# Patient Record
Sex: Male | Born: 1945 | Race: White | Hispanic: No | Marital: Married | Smoking: Current every day smoker
Health system: Southern US, Community
[De-identification: ages and names within clinical notes are randomized; demographics above are authoritative.]

## PROBLEM LIST (undated history)

## (undated) DIAGNOSIS — I714 Abdominal aortic aneurysm, without rupture, unspecified: Secondary | ICD-10-CM

## (undated) DIAGNOSIS — K219 Gastro-esophageal reflux disease without esophagitis: Secondary | ICD-10-CM

## (undated) DIAGNOSIS — J45909 Unspecified asthma, uncomplicated: Secondary | ICD-10-CM

## (undated) DIAGNOSIS — R058 Other specified cough: Secondary | ICD-10-CM

## (undated) DIAGNOSIS — J449 Chronic obstructive pulmonary disease, unspecified: Secondary | ICD-10-CM

## (undated) DIAGNOSIS — R05 Cough: Secondary | ICD-10-CM

## (undated) DIAGNOSIS — I10 Essential (primary) hypertension: Secondary | ICD-10-CM

## (undated) DIAGNOSIS — E785 Hyperlipidemia, unspecified: Secondary | ICD-10-CM

## (undated) DIAGNOSIS — F419 Anxiety disorder, unspecified: Secondary | ICD-10-CM

## (undated) DIAGNOSIS — J40 Bronchitis, not specified as acute or chronic: Secondary | ICD-10-CM

## (undated) DIAGNOSIS — D369 Benign neoplasm, unspecified site: Secondary | ICD-10-CM

## (undated) HISTORY — DX: Abdominal aortic aneurysm, without rupture, unspecified: I71.40

## (undated) HISTORY — DX: Hyperlipidemia, unspecified: E78.5

## (undated) HISTORY — DX: Chronic obstructive pulmonary disease, unspecified: J44.9

## (undated) HISTORY — DX: Essential (primary) hypertension: I10

## (undated) HISTORY — DX: Abdominal aortic aneurysm, without rupture: I71.4

## (undated) HISTORY — DX: Cough: R05

## (undated) HISTORY — DX: Bronchitis, not specified as acute or chronic: J40

## (undated) HISTORY — DX: Other specified cough: R05.8

## (undated) HISTORY — DX: Benign neoplasm, unspecified site: D36.9

---

## 1985-01-22 HISTORY — PX: SPINE SURGERY: SHX786

## 1998-11-17 ENCOUNTER — Inpatient Hospital Stay (HOSPITAL_COMMUNITY): Admission: EM | Admit: 1998-11-17 | Discharge: 1998-11-18 | Payer: Self-pay | Admitting: Cardiology

## 2000-06-10 ENCOUNTER — Ambulatory Visit (HOSPITAL_COMMUNITY): Admission: RE | Admit: 2000-06-10 | Discharge: 2000-06-10 | Payer: Self-pay | Admitting: Pulmonary Disease

## 2000-06-17 ENCOUNTER — Ambulatory Visit (HOSPITAL_COMMUNITY): Admission: RE | Admit: 2000-06-17 | Discharge: 2000-06-17 | Payer: Self-pay | Admitting: Internal Medicine

## 2000-06-17 HISTORY — PX: COLONOSCOPY: SHX174

## 2000-06-20 ENCOUNTER — Ambulatory Visit (HOSPITAL_COMMUNITY): Admission: RE | Admit: 2000-06-20 | Discharge: 2000-06-20 | Payer: Self-pay | Admitting: Internal Medicine

## 2000-09-16 ENCOUNTER — Ambulatory Visit (HOSPITAL_COMMUNITY): Admission: RE | Admit: 2000-09-16 | Discharge: 2000-09-16 | Payer: Self-pay | Admitting: Internal Medicine

## 2000-09-16 HISTORY — PX: COLONOSCOPY: SHX174

## 2003-01-23 HISTORY — PX: FOOT SURGERY: SHX648

## 2003-10-21 ENCOUNTER — Encounter: Admission: RE | Admit: 2003-10-21 | Discharge: 2003-10-21 | Payer: Self-pay | Admitting: Orthopedic Surgery

## 2003-11-12 ENCOUNTER — Inpatient Hospital Stay (HOSPITAL_COMMUNITY): Admission: AD | Admit: 2003-11-12 | Discharge: 2003-11-16 | Payer: Self-pay | Admitting: Orthopedic Surgery

## 2003-11-13 ENCOUNTER — Ambulatory Visit: Payer: Self-pay | Admitting: Internal Medicine

## 2003-11-17 ENCOUNTER — Encounter (HOSPITAL_COMMUNITY): Admission: RE | Admit: 2003-11-17 | Discharge: 2003-12-17 | Payer: Self-pay | Admitting: Orthopedic Surgery

## 2003-12-21 ENCOUNTER — Encounter (HOSPITAL_COMMUNITY): Admission: RE | Admit: 2003-12-21 | Discharge: 2004-01-20 | Payer: Self-pay | Admitting: Orthopedic Surgery

## 2003-12-24 ENCOUNTER — Encounter: Admission: RE | Admit: 2003-12-24 | Discharge: 2003-12-24 | Payer: Self-pay | Admitting: Orthopedic Surgery

## 2004-04-18 ENCOUNTER — Ambulatory Visit (HOSPITAL_COMMUNITY): Admission: RE | Admit: 2004-04-18 | Discharge: 2004-04-18 | Payer: Self-pay | Admitting: Orthopedic Surgery

## 2004-06-28 ENCOUNTER — Encounter: Admission: RE | Admit: 2004-06-28 | Discharge: 2004-06-28 | Payer: Self-pay | Admitting: Vascular Surgery

## 2005-02-27 ENCOUNTER — Ambulatory Visit (HOSPITAL_COMMUNITY): Admission: RE | Admit: 2005-02-27 | Discharge: 2005-02-27 | Payer: Self-pay | Admitting: Urology

## 2006-02-20 ENCOUNTER — Encounter: Admission: RE | Admit: 2006-02-20 | Discharge: 2006-02-20 | Payer: Self-pay | Admitting: Vascular Surgery

## 2006-07-09 ENCOUNTER — Ambulatory Visit (HOSPITAL_COMMUNITY): Admission: RE | Admit: 2006-07-09 | Discharge: 2006-07-09 | Payer: Self-pay | Admitting: Family Medicine

## 2006-07-31 ENCOUNTER — Ambulatory Visit: Payer: Self-pay | Admitting: Vascular Surgery

## 2007-02-04 ENCOUNTER — Encounter: Admission: RE | Admit: 2007-02-04 | Discharge: 2007-02-04 | Payer: Self-pay | Admitting: Vascular Surgery

## 2007-02-04 ENCOUNTER — Ambulatory Visit: Payer: Self-pay | Admitting: Vascular Surgery

## 2007-06-30 ENCOUNTER — Ambulatory Visit (HOSPITAL_COMMUNITY): Admission: RE | Admit: 2007-06-30 | Discharge: 2007-06-30 | Payer: Self-pay | Admitting: Family Medicine

## 2007-08-05 ENCOUNTER — Ambulatory Visit: Payer: Self-pay | Admitting: Vascular Surgery

## 2008-02-03 ENCOUNTER — Encounter: Admission: RE | Admit: 2008-02-03 | Discharge: 2008-02-03 | Payer: Self-pay | Admitting: Vascular Surgery

## 2008-02-03 ENCOUNTER — Ambulatory Visit: Payer: Self-pay | Admitting: Vascular Surgery

## 2008-08-19 ENCOUNTER — Ambulatory Visit (HOSPITAL_COMMUNITY): Admission: RE | Admit: 2008-08-19 | Discharge: 2008-08-19 | Payer: Self-pay | Admitting: Family Medicine

## 2008-10-07 ENCOUNTER — Ambulatory Visit: Payer: Self-pay | Admitting: Vascular Surgery

## 2009-07-14 ENCOUNTER — Ambulatory Visit: Payer: Self-pay | Admitting: Vascular Surgery

## 2009-09-28 ENCOUNTER — Emergency Department (HOSPITAL_COMMUNITY): Admission: EM | Admit: 2009-09-28 | Discharge: 2009-09-28 | Payer: Self-pay | Admitting: Emergency Medicine

## 2010-02-12 ENCOUNTER — Encounter: Payer: Self-pay | Admitting: Vascular Surgery

## 2010-03-29 ENCOUNTER — Ambulatory Visit: Payer: Self-pay | Admitting: Vascular Surgery

## 2010-04-19 ENCOUNTER — Ambulatory Visit (INDEPENDENT_AMBULATORY_CARE_PROVIDER_SITE_OTHER): Payer: Medicare Other | Admitting: Vascular Surgery

## 2010-04-19 ENCOUNTER — Encounter (INDEPENDENT_AMBULATORY_CARE_PROVIDER_SITE_OTHER): Payer: Medicare Other

## 2010-04-19 DIAGNOSIS — I723 Aneurysm of iliac artery: Secondary | ICD-10-CM

## 2010-04-20 NOTE — Assessment & Plan Note (Signed)
OFFICE VISIT  Jared Guerrero, Jared Guerrero DOB:  January 08, 1946                                       04/19/2010 JXBJY#:[id number]  I saw the patient in the office today for continued follow-up of his bilateral common iliac artery aneurysms.  I last saw him in June 2011. At that time the largest aneurysm on the right measured 3.3 cm in maximum diameter.  As far back as January 2008 it was 3.1 cm in maximum diameter.  We therefore stretched his follow-up out to 9 months and he comes in for a 62-month follow-up visit.  Of note, he has had no abdominal or back pain.  He does have a history of diabetes, which has been stable on his current medications.  He also has hypertension and hypercholesterolemia, also both stable on his current medications and followed closely by Dr. Phillips Odor.  SOCIAL HISTORY:  He is married.  He has one child.  He smokes a pack per day of cigarettes.  REVIEW OF SYSTEMS:  CARDIOVASCULAR:  He has had no chest pain, chest pressure, palpitations or arrhythmias. PULMONARY:  He has had a productive cough. MUSCULOSKELETAL:  He does admit to some joint pain.  PHYSICAL EXAMINATION:  This is a pleasant 65 year old gentleman who appears his stated age.  Blood pressure is 119/76, heart rate is 80, saturation 98%.  Lungs are clear bilaterally to auscultation.  On cardiovascular exam he has a regular rate and rhythm.  He has palpable femoral, popliteal and pedal pulses bilaterally.  The abdomen is soft and nontender.  His aneurysm is palpable and nontender.  Musculoskeletal exam:  There is no major deformity or cyanosis.  Neurologic exam:  He has no focal weakness or paresthesias.  I have independently interpreted his arterial duplex of his aneurysm, which shows the maximum diameter of his right common iliac artery is 3.5 cm and on the left 3.4 cm.  He does not have an infrarenal abdominal aortic aneurysm.  Thus, the aneurysm has enlarged perhaps 2 mm  compared to the study 9 months ago, which may be within the limits of the machine.  Again, these aneurysms have remained very stable over the last 5 years.  I think it is safe to continue to follow up 9 months; however, when I see him back in 9 months I think I will obtain a CT scan of the abdomen and pelvis to better assess the morphology of the aneurysms.  We have again discussed the importance of tobacco cessation.  He does understand that this does increase his risk of continued aneurysm enlargement.  I will see him back in 9 months.  He knows to call sooner if he has problems.    Di Kindle. Edilia Bo, M.D. Electronically Signed  CSD/MEDQ  D:  04/19/2010  T:  04/20/2010  Job:  [id number]  cc:   Corrie Mckusick, M.D.

## 2010-04-27 NOTE — Procedures (Unsigned)
DUPLEX ULTRASOUND OF ABDOMINAL AORTA  INDICATION:  Right common iliac artery aneurysm.  HISTORY: Diabetes:  Yes. Cardiac:  No. Hypertension:  Yes. Smoking:  Yes. Connective Tissue Disorder: Family History:  Grandfather had aneurysm. Previous Surgery:  No.  DUPLEX EXAM:         AP (cm)                   TRANSVERSE (cm) Proximal             2.5 cm                    2.5 cm Mid                  2.4 cm                    2.3 cm Distal               2.7 cm                    2.5 cm Right Iliac          3.5 cm                    3.4 cm Left Iliac           1.6 cm                    1.4 cm  PREVIOUS:  Date: 07/14/2009  AP:  3.3 (right CIA)  TRANSVERSE:  3.3 (right CIA)  IMPRESSION: 1. Aneurysmal dilatation of the right common iliac artery with no     significant change in maximum diameter. 2. No evidence of abdominal aortic aneurysm noted.  ___________________________________________ Di Kindle. Edilia Bo, M.D.  CH/MEDQ  D:  04/19/2010  T:  04/19/2010  Job:  [id number]

## 2010-04-28 ENCOUNTER — Other Ambulatory Visit: Payer: Self-pay | Admitting: Vascular Surgery

## 2010-04-28 DIAGNOSIS — I723 Aneurysm of iliac artery: Secondary | ICD-10-CM

## 2010-04-30 LAB — BASIC METABOLIC PANEL
CO2: 26 mEq/L (ref 19–32)
Calcium: 10 mg/dL (ref 8.4–10.5)
Creatinine, Ser: 1.01 mg/dL (ref 0.4–1.5)
GFR calc non Af Amer: >60 mL/min (ref >60)
Glucose, Bld: 100 mg/dL — ABNORMAL HIGH (ref 70–99)

## 2010-06-06 NOTE — Procedures (Signed)
DUPLEX ULTRASOUND OF ABDOMINAL AORTA   INDICATION:  Right common iliac artery aneurysm.   HISTORY:  Diabetes:  Yes.  Cardiac:  No.  Hypertension:  Yes.  Smoking:  Yes.  Connective Tissue Disorder:  Family History:  Grandfather had aneurysm.  Previous Surgery:  No.   DUPLEX EXAM:         AP (cm)                   TRANSVERSE (cm)  Proximal             2.7 cm                    2.9 cm  Mid                  2.2 cm                    2.4 cm  Distal               2.5 cm                    2.2 cm  Right Iliac          3.3 cm                    3.3 cm  Left Iliac           1.1 cm                    1.2 cm   PREVIOUS:  Date: 08/19/2008 (CT)  AP:  3.1 (right CIA)  TRANSVERSE:  3.0  (right CIA)   IMPRESSION:  1. Aneurysmal dilatation of the right proximal common iliac artery      with no significant change in the maximum diameter when compared to      the previous CT.  2. No evidence of abdominal aortic aneurysm noted.   ___________________________________________  Di Kindle. Edilia Bo, M.D.   CH/MEDQ  D:  07/14/2009  T:  07/14/2009  Job:  [id number]

## 2010-06-06 NOTE — Assessment & Plan Note (Signed)
OFFICE VISIT   AMMAN, BARTEL  DOB:  [birth year]                                       02/04/2007  ZOXWR#:[id number]   I saw the patient in the office today for continued followup of his  right common iliac artery aneurysm.  I last saw him in January 2008 at  which time the maximum diameter was 3.1 cm.  He was back here in July,  at which time the aneurysm remained 3.1 cm in maximum diameter by  duplex.  He comes in today for a 65-month followup visit with a CT scan  done today.   Since I saw him last, he has had no history of abdominal or back pain.   PAST MEDICAL HISTORY:  Significant for non-insulin dependent diabetes  and hypertension.  He denies any history of previous myocardial  infarction or history of congestive heart failure.   SOCIAL HISTORY:  He does continue to smoke a pack per day of cigarettes.   REVIEW OF SYSTEMS:  He has had no recent chest pain, chest pressure,  palpitations, or arrhythmias.  He has had no bronchitis, asthma, or wheezing.   PHYSICAL EXAMINATION:  This is a pleasant 65 year old gentleman who  appears his stated age.  Blood pressure 128/72, heart rate is 90.  I do  not detect any carotid bruits.  Lungs are clear bilaterally to  auscultation.  On cardiac exam, he has a regular rate and rhythm.  His  abdomen is soft and nontender.  His aorta is palpable and nontender.  He  has palpable femoral pulses and warm, well-perfused feet.  He has no  significant lower extremity swelling.   I reviewed his CT scan.  The maximum diameter I can obtain of the right  common iliac artery aneurysm is 3.2 cm.  Thus, there has been no  significant change over the last year.  I would only consider elective  repair if this enlarged significantly.  I plan on seeing him back in 6  months with a followup duplex scan.  He knows to call sooner if he has  problems.  We have, again, discussed the importance of tobacco  cessation.   Di Kindle. Edilia Bo, M.D.  Electronically Signed   CSD/MEDQ  D:  02/04/2007  T:  02/05/2007  Job:  [id number]

## 2010-06-06 NOTE — Assessment & Plan Note (Signed)
OFFICE VISIT   SIERRA, BISSONETTE  DOB:  07-09-1945                                       10/07/2008  ZOXWR#:[id number]   I saw the patient in the office today for followup of his right common  iliac artery aneurysm.  I had last seen him in January of this year when  the aneurysm measured 3.28 cm in maximum diameter.  This had been  relatively stable in size over the last 4 years and for this reason I  stretched his followup out to 9 months.  However, the patient had been  having some weight loss and this prompted a CT scan of the chest,  abdomen and pelvis and so he comes today with his CT scan for continued  followup of his right common iliac artery aneurysm.  Since I saw him  last he has had no significant abdominal or back pain.   REVIEW OF SYSTEMS:  He has had no chest pain, chest pressure,  palpitations or arrhythmias.  He has had no productive cough,  bronchitis, asthma or wheezing.  He has had some weight loss recently.   PHYSICAL EXAMINATION:  This is a pleasant 65 year old gentleman who  appears his stated age.  His blood pressure is 120/74, heart rate is 75.  Lungs are clear bilaterally to auscultation.  On cardiac exam he has a  regular rate and rhythm.  His abdomen is soft and nontender.  He has  palpable femoral, popliteal and pedal pulses bilaterally with no  evidence of atheroembolic disease.   I did review his CT scan and the maximum diameter of his right common  iliac artery aneurysm is 3.1 cm and has thus not changed in size since  January.  This again has been stable over several years.   With respect to his aneurysm I have recommended a followup ultrasound in  9 months and I will see him back at that time.  We would only consider  addressing this aneurysm if it enlarged significantly.  He has  significantly calcific vessels and this would complicate surgery  somewhat.  He is not an ideal candidate for stent graft repair of the  aneurysm as it extends up to the bifurcation.  With respect to his  adrenal adenoma and coronary calcifications seen on his CT scan he tells  me that Dr. Phillips Odor plans on following up on these.   Finally, we did again discuss the importance of tobacco cessation.  He  continues to smoke one and a half pack per day of cigarettes.   Di Kindle. Edilia Bo, M.D.  Electronically Signed   CSD/MEDQ  D:  10/07/2008  T:  10/08/2008  Job:  [id number]   cc:   Corrie Mckusick, M.D.

## 2010-06-06 NOTE — Assessment & Plan Note (Signed)
OFFICE VISIT   Jared, Guerrero  DOB:  Aug 28, 1945                                       07/14/2009  BJYNW#:[id number]   I saw the patient in the office today for continued follow-up of his  right common iliac artery aneurysm.  Since I saw him last in September  2010, he has had no significant abdominal or back pain.  There has been  no significant change in his medical history.  He does have diabetes  which is stable on his current medications.  He also has a history of  hypertension and hypercholesterolemia both of which are stable on his  current medications.  He has had no previous history of myocardial  infarction or congestive heart failure.   SOCIAL HISTORY:  He is married.  He has one child.  He smokes a pack per  day of cigarettes and has been smoking for as long as he can remember.   REVIEW OF SYSTEMS:  CARDIOVASCULAR:  He had no chest pain, chest  pressure, palpitations or arrhythmias.  He has had no claudication, rest  pain or nonhealing ulcers.  He has had no history of DVT or phlebitis.  PULMONARY:  He has had history of bronchitis in the past.  ORTHO:  He does have occasional problems with arthritis.   PHYSICAL EXAMINATION:  This is a pleasant 65 year old gentleman who  appears his stated age.  Temperature is 98.  Blood pressure 125/83,  heart rate is 64.  HEENT:  Unremarkable.  Lungs:  Clear bilaterally to  auscultation without rales, rhonchi or wheezing.  Cardiovascular exam:  I do not detect any carotid bruits.  He has a regular rate and rhythm.  He has palpable femoral pulses.  He has palpable pedal pulses.  Abdomen;  Soft and nontender.  He has normal pitched bowel sounds.   I did independently interpret his arterial duplex of his aneurysm which  shows that his right common iliac artery aneurysm measures 3.3 cm in  maximum diameter.   This has been no significant change in the size of his aneurysm.  Back  in January of 2008 the  aneurysm measured 3.1 cm in maximum diameter.  He  had an ultrasound in July 2009 which showed the aneurysm was 3.3 cm.  He  had subsequent CAT scans which showed  3.2 cm right common iliac artery  aneurysm.  Thus comparing the previous ultrasound there has been no  change at all in he size of this and there has been no change in the  size of this aneurysm really since January of 2008.   I think we are safe to continue with follow-up at 9 months.  I have  ordered a follow-up duplex scan in 9 months and I will see him back at  that time.  He knows to call sooner if he has had problems.  We have  previously discussed the importance of tobacco cessation in lowering his  risks of continued aneurysm expansion.     Di Kindle. Edilia Bo, M.D.  Electronically Signed   CSD/MEDQ  D:  07/14/2009  T:  07/15/2009  Job:  [id number]   cc:   Corrie Mckusick, M.D.

## 2010-06-06 NOTE — Procedures (Signed)
DUPLEX ULTRASOUND OF ABDOMINAL AORTA   INDICATION:  Follow up right common iliac artery aneurysm.   HISTORY:  Diabetes:  Yes.  Cardiac:  No.  Hypertension:  Yes.  Smoking:  Yes.  Connective Tissue Disorder:  Family History:  Grandfather.  Previous Surgery:  No.   DUPLEX EXAM:         AP (cm)                   TRANSVERSE (cm)  Proximal             1.91 Cm                   1.96 cm  Mid                  2.33 cm                   2.23 cm  Distal               2.12 cm                   2.12 cm  Right Iliac          3.27 cm                   3.28 cm  Left Iliac           1.29 cm                   1.31 cm   PREVIOUS:  Date: 02/04/07 (CT)  AP:  3.2 (right CIA)  TRANSVERSE:   IMPRESSION:  1. No evidence of abdominal aortic aneurysm.  2. Stable right common iliac artery aneurysm measuring 3.27 cm X 3.28      cm.  3. No evidence of left common iliac artery aneurysm.  4. No significant changes from previous study.   ___________________________________________  Di Kindle. Edilia Bo, M.D.   AS/MEDQ  D:  08/05/2007  T:  08/05/2007  Job:  [id number]

## 2010-06-06 NOTE — Procedures (Signed)
DUPLEX ULTRASOUND OF ABDOMINAL AORTA   INDICATION:  Followup known right common iliac artery aneurysm.   HISTORY:  Diabetes:  Yes  Cardiac:  No  Hypertension:  Yes  Smoking:  Yes  Connective Tissue Disorder:  Family History:  Grandfather  Previous Surgery:  No   DUPLEX EXAM:         AP (cm)                   TRANSVERSE (cm)  Proximal             1.53 cm                   1.58 cm  Mid                  2.13 cm                   2.20 cm  Distal               2.10 cm                   2.09 cm  Right Iliac          3.11 cm                   2.79 cm  Left Iliac           1.30 cm                   1.33 cm   PREVIOUS:  Previous CIA by CT , January 24, 2006  Right equals 3.1 x 2.9 with a length of 3.3   IMPRESSION:  The abdominal aorta was imaged, Dopplered, and shows no  evidence of aneurysmal dilatation.  The right common iliac artery shows evidence of an aneurysm, measuring  3.11 cm by 2.79 cm with a length of 3.2 cm, which has a shape that  appears borderline saccular/fusiform.   ___________________________________________  Di Kindle. Edilia Bo, M.D.   AS/MEDQ  D:  07/31/2006  T:  07/31/2006  Job:  [id number]

## 2010-06-06 NOTE — Assessment & Plan Note (Signed)
OFFICE VISIT   Jared Guerrero, Jared Guerrero  DOB:  01/13/1946                                       02/03/2008  ZOXWR#:[id number]   I saw the patient in the office today for continued followup of his  right common iliac artery aneurysm.  His most recent study was in July  of 2009 when it measured 3.28 cm in maximum diameter.  He has no  significant abdominal aortic aneurysm and no left common iliac artery  aneurysm.  I had originally seen him in consultation in December of 2005  when it was 3 cm in maximum diameter.  Thus, this has been stable over  the last 4 years.  He comes in for a 6 month followup visit after CT  scan was obtained today.  Since I saw him last he has had no abdominal  or back pain.   REVIEW OF SYSTEMS:  On review of systems he has had no chest pain, chest  pressure, palpitations or arrhythmias.  He has had no bronchitis, asthma  or wheezing.   PHYSICAL EXAMINATION:  General:  On physical examination this is a  pleasant 65 year old gentleman who appears his stated age.  Vital signs:  His blood pressure is 137/84, heart rate is 76.  Neck:  I do not detect  any carotid bruits.  Lungs:  Lungs are clear bilaterally to  auscultation.  Cardiac:  He has a regular rate and rhythm.  Abdomen:  Soft and nontender.  His aneurysm is palpable and nontender.  He has  palpable femoral pulses and warm well-perfused feet without ischemic  ulcers.  He has no significant lower extremity swelling.   On review his CAT scan in the maximum diameter of the aneurysm is 3.28  cm.  Thus it has not changed over the last 6 months.  He has some  diffuse calcific disease of his iliac arteries bilaterally.  As the  aneurysm has not changed in size over the last 4 years I have  recommended we stretch his followup out to 9 months.  He is relatively  thin and so will use ultrasound for the most part for followup.  I will  see him back in 9 months with a followup duplex.  We would  only consider  repair if this enlarged significantly.  He could potentially be a  candidate for endovascular approach although he has significant calcific  disease of his iliac on the right which is fairly small.  This may limit  his options.  Unfortunately he does continue to smoke a pack per day of  cigarettes but continues to try to quit.   Di Kindle. Edilia Bo, M.D.  Electronically Signed   CSD/MEDQ  D:  02/03/2008  T:  02/04/2008  Job:  [id number]
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
## 2010-12-28 ENCOUNTER — Encounter: Payer: Self-pay | Admitting: Vascular Surgery

## 2011-01-24 ENCOUNTER — Other Ambulatory Visit: Payer: Self-pay | Admitting: Vascular Surgery

## 2011-01-25 LAB — CREATININE, SERUM: Creat: 1.12 mg/dL (ref 0.50–1.35)

## 2011-01-30 ENCOUNTER — Encounter: Payer: Self-pay | Admitting: Vascular Surgery

## 2011-01-31 ENCOUNTER — Ambulatory Visit
Admission: RE | Admit: 2011-01-31 | Discharge: 2011-01-31 | Disposition: A | Discharge status: Home / Self Care | Payer: Medicare Other | Source: Ambulatory Visit | Attending: Vascular Surgery | Admitting: Vascular Surgery

## 2011-01-31 ENCOUNTER — Encounter: Payer: Self-pay | Admitting: Vascular Surgery

## 2011-01-31 ENCOUNTER — Ambulatory Visit (INDEPENDENT_AMBULATORY_CARE_PROVIDER_SITE_OTHER): Payer: Medicare Other | Admitting: Vascular Surgery

## 2011-01-31 VITALS — BP 112/71 | HR 80 | Resp 16 | Ht 69.0 in | Wt 163.0 lb

## 2011-01-31 DIAGNOSIS — I723 Aneurysm of iliac artery: Secondary | ICD-10-CM

## 2011-01-31 NOTE — Progress Notes (Signed)
Vascular and Vein Specialist of [facility]  Patient name: Jared Guerrero MRN: [id number] DOB: 1945/04/23 Sex: male  REASON FOR VISIT: follow up of right common iliac artery aneurysm  HPI: Jared Guerrero is a 66 y.o. male who I last saw in March of 2012. At that time his right common iliac artery aneurysm measured 3.5 cm in maximum diameter. As far back in January of 2008 was 3.1 cm in maximum diameter. His remained stable in size for several years. As in for a 9 month follow up visit. I elected to do a CT of the abdomen and pelvis to further assess the morphology of this aneurysm on this follow up visit.  Since I saw him last, he said no history of abdominal or back pain. His been no significant change in his medical history. He has diabetes hyperlipidemia and hypertension all of which are stable on his current medications.  Past Medical History  Diagnosis Date  . Diabetes mellitus   . Hyperlipidemia   . Hypertension   . Productive cough   . COPD (chronic obstructive pulmonary disease)   . Bronchitis   . AAA (abdominal aortic aneurysm)     Family History  Problem Relation Age of Onset  . Heart disease Mother 91  . Heart attack Father 96  . Aneurysm Maternal Grandfather     SOCIAL HISTORY: History  Substance Use Topics  . Smoking status: Current Everyday Smoker -- 1.5 packs/day    Types: Cigarettes  . Smokeless tobacco: Not on file  . Alcohol Use: No    Allergies  Allergen Reactions  . Aspirin     "sensitive" causes upset stomach    Current Outpatient Prescriptions  Medication Sig Dispense Refill  . amLODipine-benazepril (LOTREL) 10-20 MG per capsule Take 1 capsule by mouth daily.        Marland Kitchen aspirin EC 81 MG tablet Take 81 mg by mouth daily.        . colesevelam (WELCHOL) 625 MG tablet Take 1,875 mg by mouth 2 (two) times daily with a meal.        . gabapentin (NEURONTIN) 300 MG capsule Take 300 mg by mouth 3 (three) times daily.        . rosuvastatin (CRESTOR) 5  MG tablet Take 5 mg by mouth daily.          REVIEW OF SYSTEMS: Arly.Keller ] denotes positive finding; [  ] denotes negative finding CARDIOVASCULAR:  [ ]  chest pain   [ ]  chest pressure   [ ]  palpitations   [ ]  orthopnea   [ ]  dyspnea on exertion   [ ]  claudication   [ ]  rest pain   [ ]  DVT   [ ]  phlebitis PULMONARY:   Arly.Keller ] productive cough   [ ]  asthma   [ ]  wheezing NEUROLOGIC:   [ ]  weakness  [ ]  paresthesias  [ ]  aphasia  [ ]  amaurosis  [ ]  dizziness HEMATOLOGIC:   [ ]  bleeding problems   [ ]  clotting disorders MUSCULOSKELETAL:  [ ]  joint pain   [ ]  joint swelling [ ]  leg swelling GASTROINTESTINAL: [ ]   blood in stool  [ ]   hematemesis GENITOURINARY:  [ ]   dysuria  [ ]   hematuria PSYCHIATRIC:  [ ]  history of major depression INTEGUMENTARY:  [ ]  rashes  [ ]  ulcers CONSTITUTIONAL:  [ ]  fever   [ ]  chills  PHYSICAL EXAM: Filed Vitals:   01/31/11 1030  BP: 112/71  Pulse:  80  Resp: 16  Height: 5\' 9"  (1.753 m)  Weight: 163 lb (73.936 kg)  SpO2: 98%   Body mass index is 24.07 kg/(m^2). GENERAL: The patient is a well-nourished male, in no acute distress. The vital signs are documented above. CARDIOVASCULAR: There is a regular rate and rhythm without significant murmur appreciated. I do not detect carotid bruits. He has palpable femoral and pedal pulses bilaterally. PULMONARY: There is good air exchange bilaterally without wheezing or rales. ABDOMEN: Soft and non-tender with normal pitched bowel sounds. I am unable to palpate his right common iliac artery aneurysm. MUSCULOSKELETAL: There are no major deformities or cyanosis. NEUROLOGIC: No focal weakness or paresthesias are detected. SKIN: There are no ulcers or rashes noted. PSYCHIATRIC: The patient has a normal affect.  DATA:  No results found for this basename: WBC, HGB, HCT, MCV, PLT   Lab Results  Component Value Date   NA 137 08/19/2008   K 3.9 08/19/2008   CL 102 08/19/2008   CO2 26 08/19/2008   Lab Results  Component Value Date     CREATININE 1.12 01/24/2011   I have independently interpreted his CT scan of his abdomen and pelvis. On the transverse section the largest diameter so the right common iliac artery aneurysm that I can measure is approximately 3.4 cm. He has significant diffuse calcific disease in his infrarenal aorta and bilateral common iliac arteries. Is no aneurysm on the left side. Of note the diameter of his aortic bifurcation is approximately 12 mm. This is significantly calcified.  MEDICAL ISSUES: Fortunately the aneurysm has remained stable in size. If the aneurysm more to enlarge significantly we would need to consider elective repair. The right common iliac artery aneurysm extends to the aortic bifurcation and therefore endovascular repair would require placement of a bifurcated graft. However currently I do not think is a candidate for this given that the distal aorta measures only 12 mm in maximum diameter and is significantly calcified. For the [facility] device is recommended that there be at least 19 mm distally for placement of the graft. Occluded technology will continue to improve such that if he does require repair in the future he might still be a candidate for endovascular repair. However currently I do not think he is. Have again had a long discussion about the importance of tobacco cessation and he is to call the tobacco cessation program and in [place] to try to work on this. I've ordered a follow up ultrasound in 9 months and I'll see him back at that time. He knows to call sooner if he has problems.  DICKSON,CHRISTOPHER S [facility] [place] Beeper: [phone]

## 2011-05-11 ENCOUNTER — Ambulatory Visit (HOSPITAL_COMMUNITY)
Admission: RE | Admit: 2011-05-11 | Discharge: 2011-05-11 | Disposition: A | Discharge status: Home / Self Care | Payer: Medicare Other | Source: Ambulatory Visit | Attending: Family Medicine | Admitting: Family Medicine

## 2011-05-11 ENCOUNTER — Other Ambulatory Visit (HOSPITAL_COMMUNITY): Payer: Self-pay | Admitting: Family Medicine

## 2011-05-11 DIAGNOSIS — I10 Essential (primary) hypertension: Secondary | ICD-10-CM

## 2011-05-11 DIAGNOSIS — J438 Other emphysema: Secondary | ICD-10-CM | POA: Insufficient documentation

## 2011-05-11 DIAGNOSIS — F17200 Nicotine dependence, unspecified, uncomplicated: Secondary | ICD-10-CM | POA: Insufficient documentation

## 2011-05-11 DIAGNOSIS — E785 Hyperlipidemia, unspecified: Secondary | ICD-10-CM

## 2011-05-11 DIAGNOSIS — R7301 Impaired fasting glucose: Secondary | ICD-10-CM

## 2011-10-30 ENCOUNTER — Encounter: Payer: Self-pay | Admitting: Vascular Surgery

## 2011-10-31 ENCOUNTER — Ambulatory Visit (INDEPENDENT_AMBULATORY_CARE_PROVIDER_SITE_OTHER): Payer: Medicare Other | Admitting: Vascular Surgery

## 2011-10-31 ENCOUNTER — Encounter: Payer: Self-pay | Admitting: Vascular Surgery

## 2011-10-31 VITALS — BP 130/67 | HR 82 | Ht 69.0 in | Wt 154.8 lb

## 2011-10-31 DIAGNOSIS — I714 Abdominal aortic aneurysm, without rupture, unspecified: Secondary | ICD-10-CM | POA: Insufficient documentation

## 2011-10-31 DIAGNOSIS — I723 Aneurysm of iliac artery: Secondary | ICD-10-CM

## 2011-10-31 NOTE — Progress Notes (Signed)
AAA duplex for iliac artery aneurysm performed @ [facility] 10/31/2011

## 2011-10-31 NOTE — Addendum Note (Signed)
Addended by: Sharee Pimple on: 10/31/2011 11:39 AM   Modules accepted: Orders

## 2011-10-31 NOTE — Progress Notes (Signed)
[facility] and [facility]in Specialist of [facility]  Patient name: Jared Guerrero MRN: [id number] DOB: 08/21/1945 Sex: male  REASON FOR VISIT: follow up of right common iliac artery aneurysm  HPI: Jared Guerrero is a 66 y.o. male who I been following with a right common iliac artery aneurysm. As far back as January of 2008 the aneurysm measured 3.1 cm in maximum diameter. He was last seen on 01/31/2011 at which time the aneurysm measured 3.5 cm in maximum diameter. Comes in for 9 months follow up visit. There is no significant infrarenal aortic aneurysm. He denies any abdominal pain or back pain. There has been no significant change in his medical history. His diabetes has been well controlled. He does continue to smoke 1-1/2 packs per day of cigarettes.  Past Medical History  Diagnosis Date  . Diabetes mellitus   . Hyperlipidemia   . Hypertension   . Productive cough   . COPD (chronic obstructive pulmonary disease)   . Bronchitis   . AAA (abdominal aortic aneurysm)     Family History  Problem Relation Age of Onset  . Heart disease Mother 43  . Hyperlipidemia Mother   . Heart attack Mother   . Heart attack Father 42  . Heart disease Father     before age 53  . Hyperlipidemia Father   . Aneurysm Maternal Grandfather     SOCIAL HISTORY: History  Substance Use Topics  . Smoking status: Current Every Day Smoker -- 1.5 packs/day    Types: Cigarettes  . Smokeless tobacco: Never Used   Comment: pt given [employer] hotline #  . Alcohol Use: No    Allergies  Allergen Reactions  . Aspirin     "sensitive" causes upset stomach    Current Outpatient Prescriptions  Medication Sig Dispense Refill  . amLODipine-benazepril (LOTREL) 10-20 MG per capsule Take 1 capsule by mouth daily.        Marland Kitchen aspirin EC 81 MG tablet Take 81 mg by mouth 2 (two) times daily.       . benazepril (LOTENSIN) 20 MG tablet Take 20 mg by mouth daily.      . colesevelam (WELCHOL) 625 MG tablet Take 1,875 mg by  mouth 2 (two) times daily with a meal.        . gabapentin (NEURONTIN) 300 MG capsule Take 300 mg by mouth 3 (three) times daily.        Marland Kitchen LORazepam (ATIVAN) 1 MG tablet Take 1 mg by mouth as needed.      Marland Kitchen PROAIR HFA 108 (90 BASE) MCG/ACT inhaler as needed.      . rosuvastatin (CRESTOR) 5 MG tablet Take 10 mg by mouth daily. Pt takes 1/2 tablet per day (5mg )        REVIEW OF SYSTEMS: Arly.Keller ] denotes positive finding; [  ] denotes negative finding  CARDIOVASCULAR:  [ ]  chest pain   [ ]  chest pressure   [ ]  palpitations   [ ]  orthopnea   [ ]  dyspnea on exertion   [ ]  claudication   [ ]  rest pain   [ ]  DVT   [ ]  phlebitis PULMONARY:   Arly.Keller ] productive cough   [ ]  asthma   Arly.Keller ] wheezing NEUROLOGIC:   [ ]  weakness  [ ]  paresthesias  [ ]  aphasia  [ ]  amaurosis  [ ]  dizziness HEMATOLOGIC:   [ ]  bleeding problems   [ ]  clotting disorders MUSCULOSKELETAL:  [ ]  joint pain   [ ]   joint swelling [ ]  leg swelling GASTROINTESTINAL: [ ]   blood in stool  [ ]   hematemesis GENITOURINARY:  [ ]   dysuria  [ ]   hematuria PSYCHIATRIC:  [ ]  history of major depression INTEGUMENTARY:  [ ]  rashes  [ ]  ulcers CONSTITUTIONAL:  [ ]  fever   [ ]  chills  PHYSICAL EXAM: Filed Vitals:   10/31/11 1032  BP: 130/67  Pulse: 82  Height: 5\' 9"  (1.753 m)  Weight: 154 lb 12.8 oz (70.217 kg)  SpO2: 98%   Body mass index is 22.86 kg/(m^2). GENERAL: The patient is a well-nourished male, in no acute distress. The vital signs are documented above. CARDIOVASCULAR: There is a regular rate and rhythm. I do not detect carotid bruits. He has palpable femoral, popliteal, and dorsalis pedis pulses bilaterally. He has no significant lower extremity swelling. PULMONARY: There is good air exchange bilaterally without wheezing or rales. ABDOMEN: Soft and non-tender with normal pitched bowel sounds. His aneurysm is nontender. MUSCULOSKELETAL: There are no major deformities or cyanosis. NEUROLOGIC: No focal weakness or paresthesias are  detected. SKIN: There are no ulcers or rashes noted. PSYCHIATRIC: The patient has a normal affect.  DATA:  I have independently interpreted his duplex of his aneurysm today which shows that the maximum diameter of the right common iliac artery is 3.55 cm. The left common iliac is slightly ectatic at 1.5 cm. The maximum size of the infrarenal aorta is 2.54 cm. Thus there has been no significant change in the size of his right common iliac artery aneurysm.  MEDICAL ISSUES: Given that the right common iliac artery aneurysm has remained stable in size, but it is safe to continue to follow this. I have ordered a follow up CT scan in 9 months. If this enlarge significantly we would have to consider elective repair. Based on the amount of calcific disease in his aorta with a small aortic bifurcation does not appear that he would be a candidate for stent graft. He would likely require open repair of his aneurysm. Once again we have discussed the importance of tobacco cessation and I have given him the number for [employer] tobacco cessation program.  DICKSON,CHRISTOPHER S [facility] Vein Specialists of [facility] Beeper: [phone]

## 2011-11-13 ENCOUNTER — Other Ambulatory Visit (HOSPITAL_COMMUNITY): Payer: Self-pay | Admitting: Family Medicine

## 2011-11-13 ENCOUNTER — Ambulatory Visit (HOSPITAL_COMMUNITY)
Admission: RE | Admit: 2011-11-13 | Discharge: 2011-11-13 | Disposition: A | Discharge status: Home / Self Care | Payer: Medicare Other | Source: Ambulatory Visit | Attending: Family Medicine | Admitting: Family Medicine

## 2011-11-13 DIAGNOSIS — J209 Acute bronchitis, unspecified: Secondary | ICD-10-CM

## 2011-11-13 DIAGNOSIS — J449 Chronic obstructive pulmonary disease, unspecified: Secondary | ICD-10-CM | POA: Insufficient documentation

## 2011-11-13 DIAGNOSIS — J441 Chronic obstructive pulmonary disease with (acute) exacerbation: Secondary | ICD-10-CM

## 2011-11-13 DIAGNOSIS — J4489 Other specified chronic obstructive pulmonary disease: Secondary | ICD-10-CM | POA: Insufficient documentation

## 2011-12-19 ENCOUNTER — Encounter: Payer: Self-pay | Admitting: Internal Medicine

## 2011-12-19 ENCOUNTER — Other Ambulatory Visit (INDEPENDENT_AMBULATORY_CARE_PROVIDER_SITE_OTHER): Payer: Medicare Other

## 2011-12-19 ENCOUNTER — Ambulatory Visit (INDEPENDENT_AMBULATORY_CARE_PROVIDER_SITE_OTHER): Payer: Medicare Other | Admitting: Internal Medicine

## 2011-12-19 VITALS — BP 124/70 | HR 111 | Temp 97.8°F | Ht 69.0 in | Wt 159.8 lb

## 2011-12-19 DIAGNOSIS — I10 Essential (primary) hypertension: Secondary | ICD-10-CM

## 2011-12-19 DIAGNOSIS — R259 Unspecified abnormal involuntary movements: Secondary | ICD-10-CM

## 2011-12-19 DIAGNOSIS — R251 Tremor, unspecified: Secondary | ICD-10-CM | POA: Insufficient documentation

## 2011-12-19 DIAGNOSIS — J449 Chronic obstructive pulmonary disease, unspecified: Secondary | ICD-10-CM | POA: Insufficient documentation

## 2011-12-19 LAB — CBC WITH DIFFERENTIAL/PLATELET
Basophils Absolute: 0.1 10*3/uL (ref 0.0–0.1)
Basophils Relative: 1 % (ref 0.0–3.0)
Hemoglobin: 15.1 g/dL (ref 13.0–17.0)
Lymphocytes Relative: 23.6 % (ref 12.0–46.0)
Monocytes Relative: 7.3 % (ref 3.0–12.0)
Neutro Abs: 7.4 10*3/uL (ref 1.4–7.7)
RBC: 4.85 Mil/uL (ref 4.22–5.81)
WBC: 11.6 10*3/uL — ABNORMAL HIGH (ref 4.5–10.5)

## 2011-12-19 LAB — BASIC METABOLIC PANEL
BUN: 16 mg/dL (ref 6–23)
CO2: 27 mEq/L (ref 19–32)
Chloride: 105 mEq/L (ref 96–112)
Creatinine, Ser: 1 mg/dL (ref 0.4–1.5)

## 2011-12-19 LAB — TSH: TSH: 0.75 u[IU]/mL (ref 0.35–5.50)

## 2011-12-19 MED ORDER — OLMESARTAN MEDOXOMIL 20 MG PO TABS: 20.0000 mg | ORAL_TABLET | Freq: Every day | ORAL | Status: DC

## 2011-12-19 NOTE — Progress Notes (Signed)
Quick Note:  Called, spoke with pt. Informed her of lab results and recs per MW. He verbalized understanding and voiced no further questions/concerns at this time. ______

## 2011-12-19 NOTE — Patient Instructions (Addendum)
The key is to stop smoking completely before smoking completely stops you!   Stop benazapril (lotensin)  Start benicar 20 mg daily in place of benazapril   Add Pepcid 20 mg on at bedtime   For shortness of breath > proaire 2 puffs every 4 hours   For cough/congestion> mucinex dm up to 1200 mg every 12 hours  Please remember to go to the lab   department downstairs for your tests - we will call you with the results when they are available.  Please schedule a follow up office visit in 6 weeks, call sooner if needed with pfts

## 2011-12-19 NOTE — Progress Notes (Signed)
  Subjective:    Patient ID: Jared Guerrero, male    DOB: [birth year]  MRN: [id number]  HPI  7 yowm smoker with dx of copd referred by Dr Phillips Odor 12/19/2011 to pulmonary clinic for refractory symptoms.   12/19/2011 1st pulmonary eval still smoking cc progressive decline in activity tolerance due to sob walking [place] has to stop twice, esp bad [place], x 2 years indolent onset assoc with   freq apparent aecopd last exac started Oct 17th and hasn't recovered with severe cough > light mucus and freq night time awakening on spiriva and using proaire but only uses a couple times per 24 and only helps some.    No obvious daytime variabilty or purulent sputum cp or chest tightness, subjective wheeze overt sinus or hb symptoms. No unusual exp hx or h/o childhood pna/ asthma or premature birth to his knowledge.   Sleeping ok without nocturnal  or early am exacerbation  of respiratory  c/o's or need for noct saba. Also denies any obvious fluctuation of symptoms with weather or environmental changes or other aggravating or alleviating factors except as outlined above     Review of Systems  Constitutional: Negative for fever, chills, activity change, appetite change and unexpected weight change.  HENT: Positive for ear pain, congestion and sneezing. Negative for sore throat, rhinorrhea, trouble swallowing, dental problem, voice change and postnasal drip.   Eyes: Negative for visual disturbance.  Respiratory: Positive for cough and shortness of breath. Negative for choking.   Cardiovascular: Negative for chest pain and leg swelling.  Gastrointestinal: Negative for nausea, vomiting and abdominal pain.  Genitourinary: Negative for difficulty urinating.  Musculoskeletal: Negative for arthralgias.  Skin: Negative for rash.  Psychiatric/Behavioral: Negative for behavioral problems and confusion.       Objective:   Physical Exam  amb pleasant wm  Wt 159 12/19/2011  HEENT mild turbinate  edema.  Oropharynx no thrush or excess pnd or cobblestoning.  No JVD or cervical adenopathy. Mild accessory muscle hypertrophy. Trachea midline, nl thryroid. Chest was hyperinflated by percussion with diminished breath sounds and moderate increased exp time without wheeze. Hoover sign positive at mid inspiration. Regular rate and rhythm without murmur gallop or rub or increase P2 or edema.  Abd: no hsm, nl excursion. Ext warm without cyanosis or clubbing. Neuro pos slt hyperkinetic, fine resting tremor bilaterally, no cogwheeling  cxr 11/13/11 Copd       Assessment & Plan:

## 2011-12-19 NOTE — Assessment & Plan Note (Addendum)
Symptoms are markedly disproportionate to objective findings and not clear this is a lung problem but pt does appear to have difficult airway management issues. DDX of  difficult airways managment all start with A and  include Adherence, Ace Inhibitors, Acid Reflux, Active Sinus Disease, Alpha 1 Antitripsin deficiency, Anxiety masquerading as Airways dz,  ABPA,  allergy(esp in young), Aspiration (esp in elderly), Adverse effects of DPI,  Active smokers, plus two Bs  = Bronchiectasis and Beta blocker use..and one C= CHF  Adherence is always the initial "prime suspect" and is a multilayered concern that requires a "trust but verify" approach in every patient - starting with knowing how to use medications, especially inhalers, correctly, keeping up with refills and understanding the fundamental difference between maintenance and prns vs those medications only taken for a very short course and then stopped and not refilled. The proper method of use, as well as anticipated side effects, of a metered-dose inhaler are discussed and demonstrated to the patient. Improved effectiveness after extensive coaching during this visit to a level of approximately  75%  ? acei related > try off (see HBP)  ? Acid reflux > rx gerd  No evidence active sinus dz or chf but they are in the ddx

## 2011-12-20 DIAGNOSIS — I10 Essential (primary) hypertension: Secondary | ICD-10-CM | POA: Insufficient documentation

## 2011-12-20 NOTE — Assessment & Plan Note (Signed)

## 2011-12-20 NOTE — Assessment & Plan Note (Signed)
?   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

## 2011-12-26 ENCOUNTER — Telehealth: Payer: Self-pay | Admitting: Internal Medicine

## 2011-12-26 ENCOUNTER — Encounter: Payer: Self-pay | Admitting: Pulmonary Disease

## 2011-12-26 ENCOUNTER — Ambulatory Visit (INDEPENDENT_AMBULATORY_CARE_PROVIDER_SITE_OTHER): Payer: Medicare Other | Admitting: Pulmonary Disease

## 2011-12-26 VITALS — BP 124/68 | HR 106 | Temp 97.4°F | Ht 69.0 in | Wt 157.2 lb

## 2011-12-26 DIAGNOSIS — Z720 Tobacco use: Secondary | ICD-10-CM

## 2011-12-26 DIAGNOSIS — J449 Chronic obstructive pulmonary disease, unspecified: Secondary | ICD-10-CM

## 2011-12-26 DIAGNOSIS — F17200 Nicotine dependence, unspecified, uncomplicated: Secondary | ICD-10-CM

## 2011-12-26 DIAGNOSIS — F17210 Nicotine dependence, cigarettes, uncomplicated: Secondary | ICD-10-CM | POA: Insufficient documentation

## 2011-12-26 MED ORDER — BUDESONIDE-FORMOTEROL FUMARATE 160-4.5 MCG/ACT IN AERO: 2.0000 | INHALATION_SPRAY | Freq: Two times a day (BID) | RESPIRATORY_TRACT | Status: DC

## 2011-12-26 NOTE — Telephone Encounter (Signed)
No message needed.  Patient has appt to see RA at 11:30

## 2011-12-26 NOTE — Assessment & Plan Note (Addendum)
He has moderate COPD -fev1 at 48% Stay on spiriva Add symbicort 160- 2 puffs twice daily Use rescue inhaler every 6h  as needed - ok to use this for nocturnal symptoms Call us if no better - may need short course of prednisone Pulm rehab referral in the future Spent extra time going over fletcher curve & prognosis of copd

## 2011-12-26 NOTE — Patient Instructions (Addendum)
You have moderate COPD -lung function at 48% Stay on spiriva Add symbicort 160- 2 puffs twice daily Use rescue inhaler every 6h  as needed - ok to use this for nocturnal symptoms Call us if no better - may need short course of prednisone You have to QUIT smoking - ecigarette OK - we discussed chantix

## 2011-12-26 NOTE — Assessment & Plan Note (Signed)
You have to QUIT smoking - ecigarette OK - we discussed chantix

## 2011-12-26 NOTE — Progress Notes (Signed)
  Subjective:    Patient ID: Jared Guerrero, male    DOB: March 08, 1945, 66 y.o.   MRN: [id number]  HPI 44 yowm smoker with dx of copd referred by Dr Phillips Odor 12/19/2011 to pulmonary clinic for refractory symptoms.  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    12/26/2011  [name]. --pt reports symptoms remains since last visit 12/19/11 and are worsening-- ON last visit ACE was stopped & benicar substituted. -has had a couple of episodes of awakening at night w extreme SOB , using proair 4x per day and unsure whether to keep using if he has already done so, would like to know if pft can be done today  Spirometry showed moderate airway obstruction -fev1 48%, fvc 74%, ratio 51    Past Medical History  Diagnosis Date  . Diabetes mellitus   . Hyperlipidemia   . Hypertension   . Productive cough   . COPD (chronic obstructive pulmonary disease)   . Bronchitis   . AAA (abdominal aortic aneurysm)     Past Surgical History  Procedure Date  . Spine surgery 1987    L4-5 diskectomy  . Foot surgery 2005    right foot    History   Social History  . Marital Status: Married    Spouse Name: N/A    Number of Children: N/A  . Years of Education: N/A   Occupational History  . Retired     office work   Social History Main Topics  . Smoking status: Current Every Day Smoker -- 1.0 packs/day for 35 years    Types: Cigarettes  . Smokeless tobacco: Never Used  . Alcohol Use: No  . Drug Use: No  . Sexually Active: Not on file   Other Topics Concern  . Not on file   Social History Narrative  . No narrative on file     Review  of Systems neg for any significant sore throat, dysphagia, itching, sneezing, nasal congestion or excess/ purulent secretions, fever, chills, sweats, unintended wt loss, pleuritic or exertional cp, hempoptysis, orthopnea pnd or change in chronic leg swelling. Also denies presyncope, palpitations, heartburn, abdominal pain, nausea, vomiting, diarrhea or change in bowel or urinary habits, dysuria,hematuria, rash, arthralgias, visual complaints, headache, numbness weakness or ataxia.     Objective:   Physical Exam  Gen. Pleasant, thin man, in no distress, normal affect ENT - no lesions, no post nasal drip Neck: No JVD, no thyromegaly, no carotid bruits Lungs: no use of accessory muscles, no dullness to percussion, decreased without rales or rhonchi  Cardiovascular: Rhythm regular, heart sounds  normal, no murmurs or gallops, no peripheral edema Abdomen: soft and non-tender, no hepatosplenomegaly, BS normal. Musculoskeletal: No deformities, no cyanosis or clubbing Neuro:  alert, non focal       Assessment & Plan:

## 2012-01-07 ENCOUNTER — Telehealth: Payer: Self-pay | Admitting: Internal Medicine

## 2012-01-07 NOTE — Telephone Encounter (Signed)
Pt's spouse aware we currently do not have any samples of Symbicort or Proair.

## 2012-01-10 ENCOUNTER — Telehealth: Payer: Self-pay | Admitting: Internal Medicine

## 2012-01-10 MED ORDER — BUDESONIDE-FORMOTEROL FUMARATE 160-4.5 MCG/ACT IN AERO: 2.0000 | INHALATION_SPRAY | Freq: Two times a day (BID) | RESPIRATORY_TRACT | Status: DC

## 2012-01-10 MED ORDER — TIOTROPIUM BROMIDE MONOHYDRATE 18 MCG IN CAPS: 18.0000 ug | ORAL_CAPSULE | Freq: Every day | RESPIRATORY_TRACT | Status: DC

## 2012-01-10 MED ORDER — ALBUTEROL SULFATE HFA 108 (90 BASE) MCG/ACT IN AERS: 2.0000 | INHALATION_SPRAY | Freq: Four times a day (QID) | RESPIRATORY_TRACT | Status: DC | PRN

## 2012-01-10 NOTE — Telephone Encounter (Signed)
Spoke with pt's spouse I advised that he should stay on symbiocort and sprivia and use the proair as needed only. No combivent.  She verbalized understanding and sample of symbicort and spiriva are up front for pick up. No samples of proair needed/

## 2012-01-31 ENCOUNTER — Ambulatory Visit (INDEPENDENT_AMBULATORY_CARE_PROVIDER_SITE_OTHER): Payer: Medicare Other | Admitting: Internal Medicine

## 2012-01-31 ENCOUNTER — Encounter: Payer: Self-pay | Admitting: Internal Medicine

## 2012-01-31 VITALS — BP 106/60 | HR 87 | Temp 97.1°F | Ht 69.0 in | Wt 163.0 lb

## 2012-01-31 DIAGNOSIS — J449 Chronic obstructive pulmonary disease, unspecified: Secondary | ICD-10-CM

## 2012-01-31 DIAGNOSIS — F17200 Nicotine dependence, unspecified, uncomplicated: Secondary | ICD-10-CM

## 2012-01-31 LAB — PULMONARY FUNCTION TEST

## 2012-01-31 MED ORDER — OLMESARTAN MEDOXOMIL 20 MG PO TABS: ORAL_TABLET | ORAL | Status: DC

## 2012-01-31 NOTE — Progress Notes (Signed)
Subjective:    Patient ID: Jared Guerrero, male    DOB: May 14, 1945    MRN: [id number]  HPI  83 yowm smoker with dx of copd referred by Dr Phillips Odor 12/19/2011 to pulmonary clinic for refractory symptoms.   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  rec trial off acei    12/26/2011 Jared Guerrero [name]. --pt reports symptoms remains since last visit 12/19/11 and are worsening-- ON last visit ACE was stopped & benicar substituted. -has had a couple of episodes of awakening at night w extreme SOB , using proair 4x per day and unsure whether to keep using if he has already done so, would like to know if pft can be done today  Spirometry showed moderate airway obstruction -fev1 48%, fvc 74%, ratio 51 rec You have moderate COPD -lung function at 48% Stay on spiriva Add symbicort 160- 2 puffs twice daily Use rescue inhaler every 6h  as needed  01/31/2012 f/u ov/[name] cc breathing better to his satisfaction and not needing rescue inhaler more than twice daily at most "out of habit, not need".  No obvious daytime variabilty or assoc chronic cough or cp or chest tightness, subjective wheeze overt sinus or hb symptoms. No unusual exp hx or h/o childhood pna/ asthma or premature birth to his knowledge.   Sleeping ok without nocturnal  or early am exacerbation  of respiratory  c/o's or need for noct saba. Also denies any obvious fluctuation of symptoms with weather or environmental changes or other aggravating or alleviating factors except as outlined above   ROS  The following are not active complaints unless bolded sore throat, dysphagia, dental  problems, itching, sneezing,  nasal congestion or excess/ purulent secretions, ear ache,   fever, chills, sweats, unintended wt loss, pleuritic or exertional cp, hemoptysis,  orthopnea pnd or leg swelling, presyncope, palpitations, heartburn, abdominal pain, anorexia, nausea, vomiting, diarrhea  or change in bowel or urinary habits, change in stools or urine, dysuria,hematuria,  rash, arthralgias, visual complaints, headache, numbness weakness or ataxia or problems with walking or coordination,  change in mood/affect or memory.         Past Medical History  Diagnosis Date  . Diabetes mellitus   . Hyperlipidemia   . Hypertension   . Productive cough   . COPD (chronic obstructive pulmonary disease)   . Bronchitis   . AAA (abdominal aortic aneurysm)     Past Surgical History  Procedure Date  . Spine surgery 1987    L4-5 diskectomy  . Foot surgery 2005    right foot    History   Social History  . Marital Status: Married    Spouse Name: N/A    Number of Children: N/A  . Years of Education: N/A   Occupational History  . Retired     office work   Social History Main Topics  . Smoking status: Current Every Day Smoker -- 1.0 packs/day for 35 years    Types: Cigarettes  . Smokeless tobacco: Never Used  . Alcohol Use: No  . Drug Use: No  . Sexually Active: Not on file  Other Topics Concern  . Not on file   Social History Narrative  . No narrative on file      Objective:   Physical Exam  Wt Readings from Last 3 Encounters:  01/31/12 163 lb (73.936 kg)  12/26/11 157 lb 3.2 oz (71.305 kg)  12/19/11 159 lb 12.8 oz (72.485 kg)    Gen. Pleasant, thin man, in no distress, normal affect ENT - no lesions, no post nasal drip Neck: No JVD, no thyromegaly, no carotid bruits Lungs: no use of accessory muscles, no dullness to percussion, decreased without rales or rhonchi  Cardiovascular: Rhythm regular, heart sounds  normal, no murmurs or gallops, no peripheral edema Abdomen:  soft and non-tender, no hepatosplenomegaly, BS normal. Musculoskeletal: No deformities, no cyanosis or clubbing Neuro:  alert, non focal  11/13/11 cxr COPD with no acute abnormalities        Assessment & Plan:

## 2012-01-31 NOTE — Assessment & Plan Note (Addendum)
-   hfa  75% p coaching - PFT's 01/31/2012 FEV1  2.15 (73%)  With ratio 56 and no better with  and dlco 84%  GOLD II well compensated but still smoking, discussed separately.  Should be able to minimize saba and depending on success with smoking cessation also wean off symbicort over time    Each maintenance medication was reviewed in detail including most importantly the difference between maintenance and as needed and under what circumstances the prns are to be used.  Please see instructions for details which were reviewed in writing and the patient given a copy.    The proper method of use, as well as anticipated side effects, of a metered-dose inhaler are discussed and demonstrated to the patient. Improved effectiveness after extensive coaching during this visit to a level of approximately  75%

## 2012-01-31 NOTE — Patient Instructions (Addendum)
Plan A is spiriva and symbicort 2 every 12 hours but as you improve you may be able to taper off the symbicort   Plan B proaire - only use if needed for short breath/ cough / wheeze  The key is to stop smoking completely before smoking completely stops you - it's not too late   Benicar 20 mg one half daily  Please schedule a follow up visit in 3 months but call sooner if needed

## 2012-01-31 NOTE — Assessment & Plan Note (Signed)
>   3 min discussion  I reviewed the Flethcher curve with patient that basically indicates  if you quit smoking when your best day FEV1 is still well preserved (which his is)  it is highly unlikely you will progress to severe disease and informed the patient there was no medication on the market that has proven to change the curve or the likelihood of progression.  Therefore stopping smoking and maintaining abstinence is the most important aspect of care, not choice of inhalers or for that matter, doctors.

## 2012-01-31 NOTE — Progress Notes (Signed)
PFT done today. 

## 2012-01-31 NOTE — Progress Notes (Deleted)
  Subjective:    Patient ID: Jared Guerrero, male    DOB: 04-06-1945  MRN: [id number]  Shortness of Breath   84 yowm smoker with dx of copd referred by Dr Phillips Odor 12/19/2011 to pulmonary clinic for refractory symptoms.  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    01/31/2012  [name]. --pt reports symptoms remains since last visit 12/19/11 and are worsening-- ON last visit ACE was stopped & benicar substituted. -has had a couple of episodes of awakening at night w extreme SOB , using proair 4x per day and unsure whether to keep using if he has already done so, would like to know if pft can be done today  Spirometry showed moderate airway obstruction -fev1 48%, fvc 74%, ratio 51    Past Medical History  Diagnosis Date  . Diabetes mellitus   . Hyperlipidemia   . Hypertension   . Productive cough   . COPD (chronic obstructive pulmonary disease)   . Bronchitis   . AAA (abdominal aortic aneurysm)     Past Surgical History  Procedure Date  . Spine surgery 1987    L4-5 diskectomy  . Foot surgery 2005    right foot    History   Social History  . Marital Status: Married    Spouse Name: N/A    Number of Children: N/A  . Years of Education: N/A   Occupational History  . Retired     office work   Social History Main Topics  . Smoking status: Current Every Day Smoker -- 1.0 packs/day for 35 years    Types: Cigarettes  . Smokeless tobacco: Never Used  . Alcohol Use: No  . Drug Use: No  . Sexually Active: Not on file   Other Topics Concern  . Not on file   Social History Narrative  . No narrative on file      Review of Systems  Respiratory: Positive for shortness of breath.    neg for any significant sore throat, dysphagia, itching, sneezing, nasal congestion or excess/ purulent secretions, fever, chills, sweats, unintended wt loss, pleuritic or exertional cp, hempoptysis, orthopnea pnd or change in chronic leg swelling. Also denies presyncope, palpitations, heartburn, abdominal pain, nausea, vomiting, diarrhea or change in bowel or urinary habits, dysuria,hematuria, rash, arthralgias, visual complaints, headache, numbness weakness or ataxia.     Objective:   Physical Exam  Gen. Pleasant, thin man, in no distress, normal affect ENT - no lesions, no post nasal drip Neck: No JVD, no thyromegaly, no carotid bruits Lungs: no use of accessory muscles, no dullness to percussion, decreased without rales or rhonchi  Cardiovascular: Rhythm regular, heart sounds  normal, no murmurs or gallops, no peripheral edema Abdomen: soft and non-tender, no hepatosplenomegaly, BS normal. Musculoskeletal: No deformities, no cyanosis or clubbing Neuro:  alert, non focal       Assessment & Plan:

## 2012-02-14 ENCOUNTER — Other Ambulatory Visit: Payer: Self-pay | Admitting: Internal Medicine

## 2012-02-14 DIAGNOSIS — J449 Chronic obstructive pulmonary disease, unspecified: Secondary | ICD-10-CM

## 2012-02-25 ENCOUNTER — Encounter: Payer: Self-pay | Admitting: Internal Medicine

## 2012-04-20 ENCOUNTER — Encounter (HOSPITAL_COMMUNITY): Payer: Self-pay | Admitting: Emergency Medicine

## 2012-04-20 ENCOUNTER — Emergency Department (HOSPITAL_COMMUNITY): Payer: Medicare Other

## 2012-04-20 ENCOUNTER — Emergency Department (HOSPITAL_COMMUNITY)
Admission: EM | Admit: 2012-04-20 | Discharge: 2012-04-20 | Disposition: A | Discharge status: Home / Self Care | Payer: Medicare Other | Attending: Emergency Medicine | Admitting: Emergency Medicine

## 2012-04-20 DIAGNOSIS — I10 Essential (primary) hypertension: Secondary | ICD-10-CM | POA: Insufficient documentation

## 2012-04-20 DIAGNOSIS — F17200 Nicotine dependence, unspecified, uncomplicated: Secondary | ICD-10-CM | POA: Insufficient documentation

## 2012-04-20 DIAGNOSIS — J4489 Other specified chronic obstructive pulmonary disease: Secondary | ICD-10-CM | POA: Insufficient documentation

## 2012-04-20 DIAGNOSIS — M545 Low back pain, unspecified: Secondary | ICD-10-CM | POA: Insufficient documentation

## 2012-04-20 DIAGNOSIS — I723 Aneurysm of iliac artery: Secondary | ICD-10-CM | POA: Insufficient documentation

## 2012-04-20 DIAGNOSIS — E119 Type 2 diabetes mellitus without complications: Secondary | ICD-10-CM | POA: Insufficient documentation

## 2012-04-20 DIAGNOSIS — Z79899 Other long term (current) drug therapy: Secondary | ICD-10-CM | POA: Insufficient documentation

## 2012-04-20 DIAGNOSIS — J449 Chronic obstructive pulmonary disease, unspecified: Secondary | ICD-10-CM | POA: Insufficient documentation

## 2012-04-20 DIAGNOSIS — E785 Hyperlipidemia, unspecified: Secondary | ICD-10-CM | POA: Insufficient documentation

## 2012-04-20 DIAGNOSIS — Z8669 Personal history of other diseases of the nervous system and sense organs: Secondary | ICD-10-CM | POA: Insufficient documentation

## 2012-04-20 LAB — BASIC METABOLIC PANEL
Chloride: 102 mEq/L (ref 96–112)
Creatinine, Ser: 0.88 mg/dL (ref 0.50–1.35)
GFR calc Af Amer: >90 mL/min (ref >90)
GFR calc non Af Amer: 87 mL/min — ABNORMAL LOW (ref >90)

## 2012-04-20 LAB — GLUCOSE, CAPILLARY: Glucose-Capillary: 103 mg/dL — ABNORMAL HIGH (ref 70–99)

## 2012-04-20 IMAGING — CT CT CTA ABD/PEL W/CM AND/OR W/O CM
3 of 7 series · 7 of 46 positions shown, 12 images · IV contrast (Omnipaque 300)
Comparison: [DATE]

CLINICAL DATA: Right hip, back and abdominal pain

CT ANGIOGRAPHY ABDOMEN AND PELVIS
TECHNIQUE: Multidetector CT imaging of the abdomen and pelvis was
performed using the standard protocol during bolus administration
of intravenous contrast.  Multiplanar reconstructed images
including MIPs were obtained and reviewed to evaluate the vascular
anatomy.
Contrast: 100mL OMNIPAQUE IOHEXOL 350 MG/ML SOLN

[Series 4: abdangio 3.0 b30f · axial · 0.71mm/px · z∈[-356,-203]mm · 2 of 153 slices shown]
[im 51/153  soft-tissue]
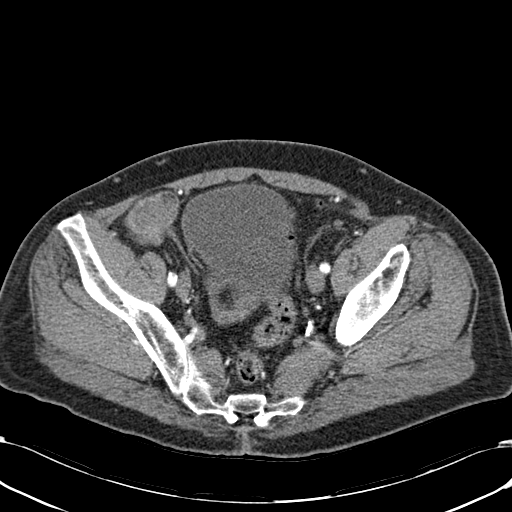
[im 102/153  soft-tissue]
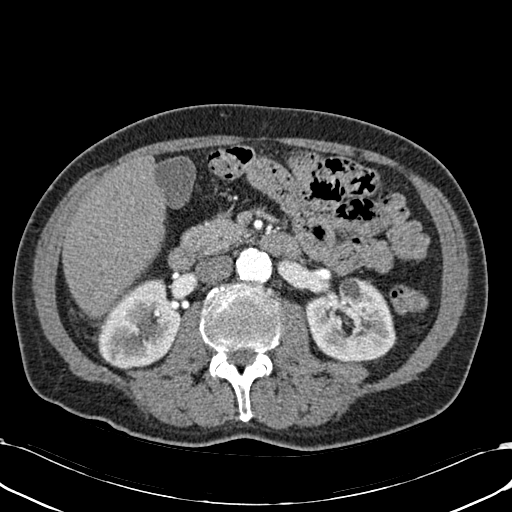

[Series 6: mpr cor post contrast · coronal · 0.70mm/px · 2 of 83 slices shown, 3 images]
[im 28/83  soft-tissue]
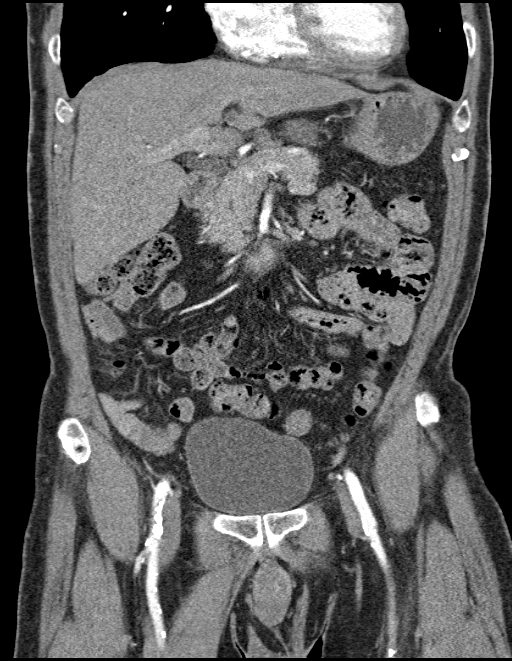
[im 28/83  bone]
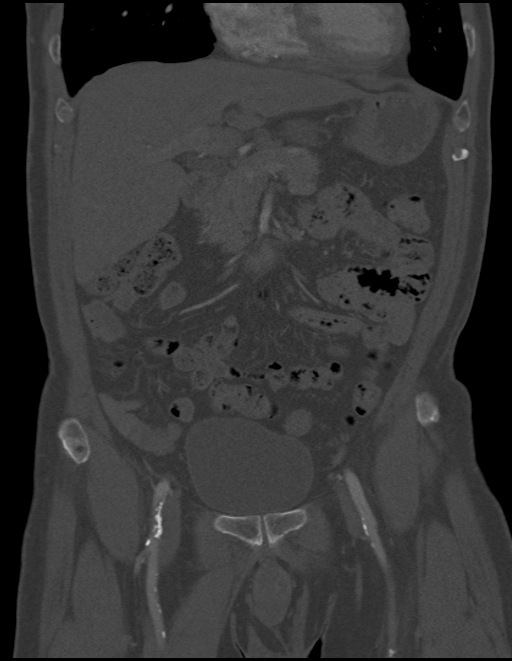
[im 55/83  soft-tissue]
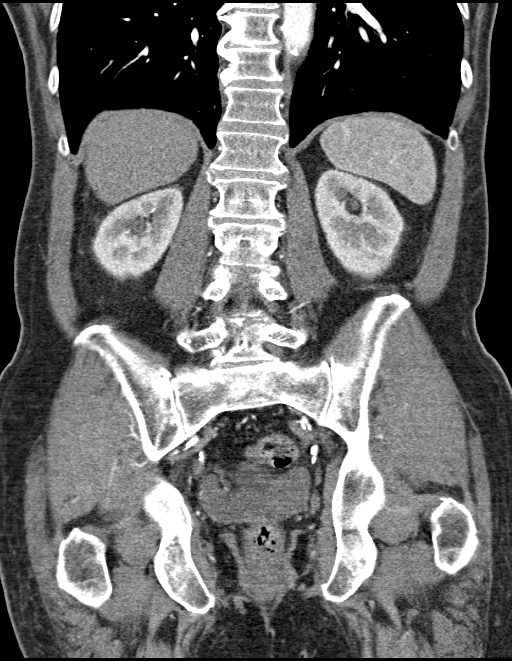

[Series 9: delay 5.0 b40f · axial · delayed · 0.71mm/px · z∈[-508,-48]mm · 3 of 93 slices shown, 7 images]
[im 1/93  soft-tissue]
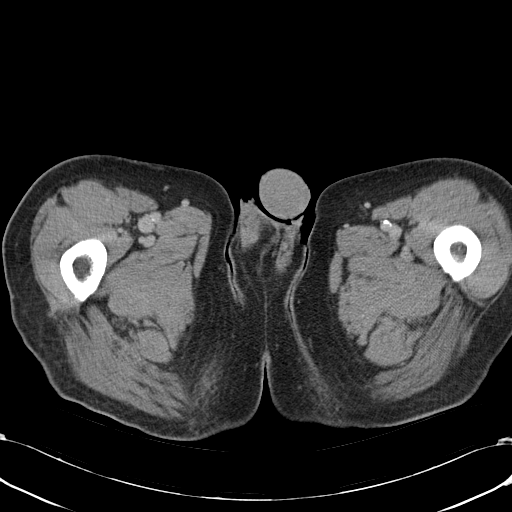
[im 1/93  lung]
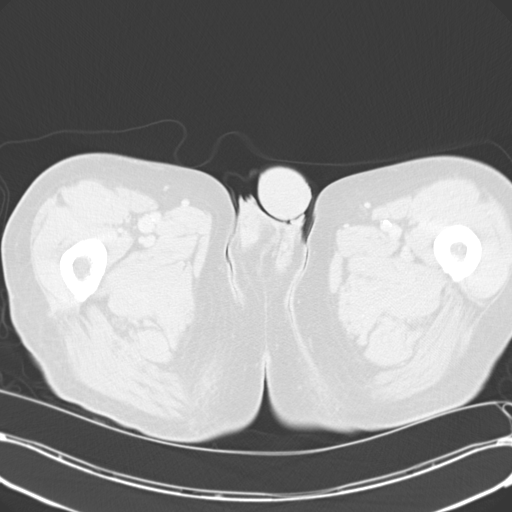
[im 1/93  bone]
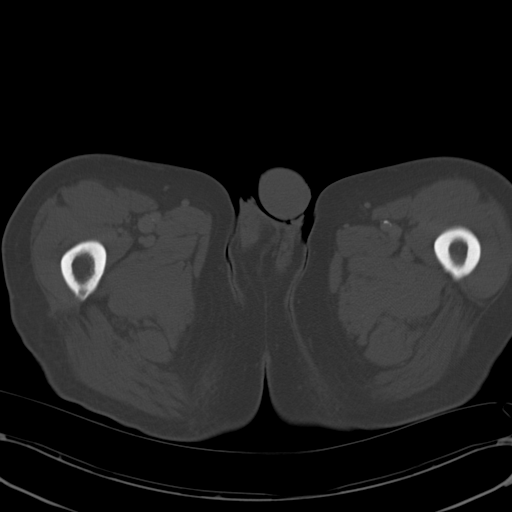
[im 47/93  soft-tissue]
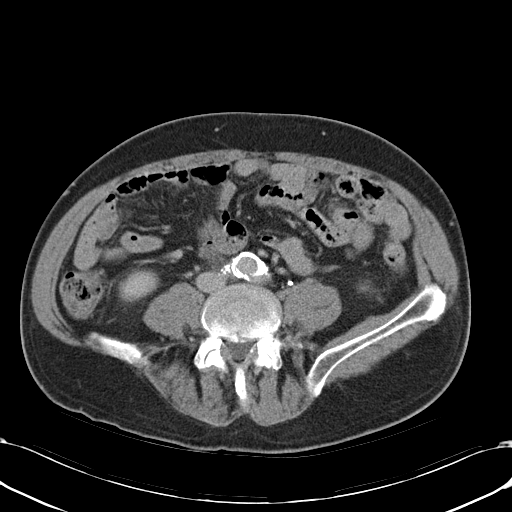
[im 47/93  lung]
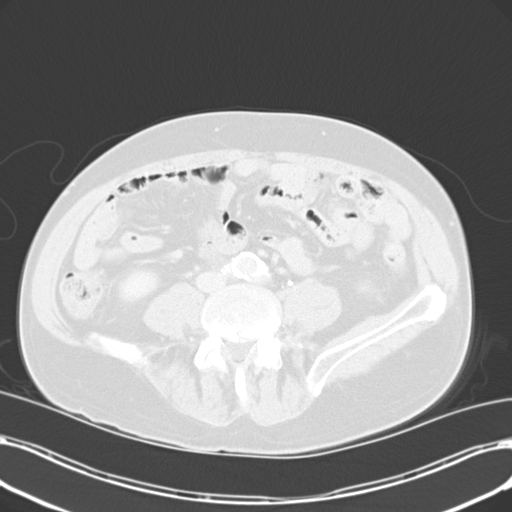
[im 93/93  soft-tissue]
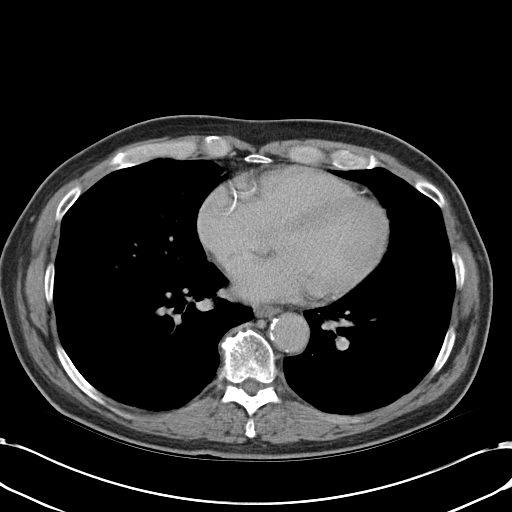
[im 93/93  lung]
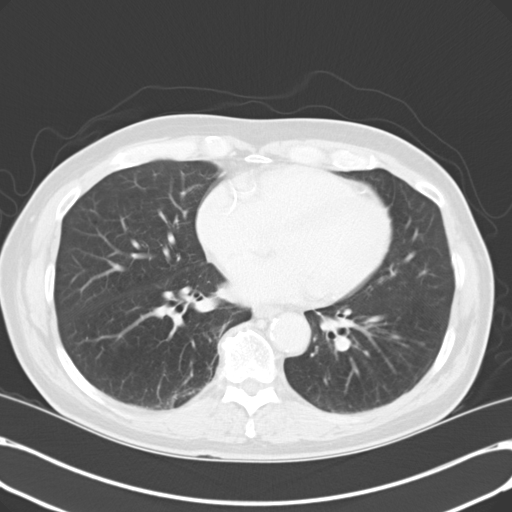

[7 of 46 positions shown; findings below may reference images not displayed]

FINDINGS: Minimal bibasilar atelectasis versus scarring
dependently.  No lower lobe pneumonia.  Normal heart size.  No
pericardial or pleural effusion.

Abdomen:  Diffuse calcific atherosclerosis of the abdominal aorta
without aneurysm, dissection, occlusive process, or retroperitoneal
hemorrhage.

Celiac, SMA, single renal arteries, and small caliber IMA all
appear patent.

Stable atherosclerotic fusiform aneurysm of the right common iliac
artery measuring 3.4 x 3.3 cm, image 84.  Heavy calcification of
the pelvic iliac vessels.  Left common iliac artery appears patent.
The internal and external iliac arteries are heavily calcified and
small in caliber but appear patent bilaterally.  The visualized
common femoral, profunda femoral, and proximal superficial femoral
arteries are also heavily calcified but patent without occlusion.
No acute pelvic hemorrhage, fluid collection, hematoma, or abscess.

Nonvascular findings:  Probable tiny sub-centimeter gallstones
noted dependently, image 39.  Liver, biliary system, pancreas, and
spleen within normal limits for early arterial phase imaging.
Stable left adrenal hypodense nodule measuring 11 mm, suspect small
left adrenal adenoma.  Right adrenal gland appears normal.  Both
kidneys demonstrate stable low density cysts, largest 11 mm in the
left mid pole, image 52.  No renal obstruction or hydronephrosis.

Negative for bowel obstruction, dilatation, ileus pattern, or free
air.

No abdominal free fluid, fluid collection, adenopathy, or abscess.

Normal appendix in the right lower quadrant.  No acute distal bowel
process.  No pelvic free fluid, fluid collection, abscess,
adenopathy, inguinal abnormality, or hernia.  Prostate gland
appears mildly enlarged.

Diffuse degenerative changes of the spine, pelvis and hips.

 Review of the MIP images confirms the above findings.
IMPRESSION: Stable atherosclerotic fusiform aneurysm of the right common iliac
artery, maximal diameter 3.4 cm.

Incidental tiny gallstones suspected

11 mm left adrenal nodule, suspect adenoma

Small incidental renal cysts

## 2012-04-20 MED ORDER — IOHEXOL 350 MG/ML SOLN
100.0000 mL | Freq: Once | INTRAVENOUS | Status: AC | PRN
  Administered 2012-04-20: 100 mL via INTRAVENOUS

## 2012-04-20 MED ORDER — METHOCARBAMOL 100 MG/ML IJ SOLN: 1000.0000 mg | Freq: Once | INTRAVENOUS | Status: DC

## 2012-04-20 MED ORDER — HYDROCODONE-ACETAMINOPHEN 7.5-325 MG PO TABS: 1.0000 | ORAL_TABLET | ORAL | Status: DC | PRN

## 2012-04-20 MED ORDER — DEXAMETHASONE 6 MG PO TABS: ORAL_TABLET | ORAL | Status: DC

## 2012-04-20 MED ORDER — FENTANYL CITRATE 0.05 MG/ML IJ SOLN
50.0000 ug | Freq: Once | INTRAMUSCULAR | Status: AC
  Administered 2012-04-20: 50 ug via INTRAVENOUS
  Filled 2012-04-20: qty 2

## 2012-04-20 MED ORDER — METHOCARBAMOL 100 MG/ML IJ SOLN
500.0000 mg | Freq: Once | INTRAVENOUS | Status: AC
  Administered 2012-04-20: 500 mg via INTRAVENOUS
  Filled 2012-04-20: qty 5

## 2012-04-20 MED ORDER — ONDANSETRON HCL 4 MG/2ML IJ SOLN
4.0000 mg | Freq: Once | INTRAMUSCULAR | Status: AC
  Administered 2012-04-20: 4 mg via INTRAVENOUS
  Filled 2012-04-20: qty 2

## 2012-04-20 MED ORDER — SODIUM CHLORIDE 0.9 % IV SOLN
Freq: Once | INTRAVENOUS | Status: AC
  Administered 2012-04-20: 12:00:00 via INTRAVENOUS

## 2012-04-20 MED ORDER — METHOCARBAMOL 500 MG PO TABS: 500.0000 mg | ORAL_TABLET | Freq: Three times a day (TID) | ORAL | Status: DC

## 2012-04-20 NOTE — ED Provider Notes (Signed)
History     CSN: [id number]  Arrival date & time 04/20/12  1041   First MD Initiated Contact with Patient 04/20/12 1129      Chief Complaint  Patient presents with  . Hip Pain    (Consider location/radiation/quality/duration/timing/severity/associated sxs/prior treatment) Patient is a 67 y.o. male presenting with hip pain. The history is provided by the patient.  Hip Pain This is a recurrent problem. The current episode started yesterday. The problem occurs constantly. The problem has been gradually worsening. Associated symptoms include arthralgias and nausea. Pertinent negatives include no abdominal pain, chest pain, coughing, fever, neck pain or vomiting. The symptoms are aggravated by standing, walking and twisting. He has tried nothing for the symptoms. The treatment provided no relief.    Past Medical History  Diagnosis Date  . Diabetes mellitus   . Hyperlipidemia   . Hypertension   . Productive cough   . COPD (chronic obstructive pulmonary disease)   . Bronchitis   . AAA (abdominal aortic aneurysm)     Past Surgical History  Procedure Laterality Date  . Spine surgery  1987    L4-5 diskectomy  . Foot surgery  2005    right foot     Family History  Problem Relation Age of Onset  . Heart disease Mother 52  . Hyperlipidemia Mother   . Heart attack Mother   . Heart attack Father 27  . Heart disease Father     before age 56  . Hyperlipidemia Father   . Aneurysm Maternal Grandfather   . Emphysema Father     was a smoker    History  Substance Use Topics  . Smoking status: Current Every Day Smoker -- 1.00 packs/day for 35 years    Types: Cigarettes  . Smokeless tobacco: Never Used  . Alcohol Use: No      Review of Systems  Constitutional: Negative for fever and activity change.       All ROS Neg except as noted in HPI  HENT: Negative for nosebleeds and neck pain.   Eyes: Negative for photophobia and discharge.  Respiratory: Negative for cough,  shortness of breath and wheezing.   Cardiovascular: Negative for chest pain and palpitations.  Gastrointestinal: Positive for nausea. Negative for vomiting, abdominal pain and blood in stool.  Genitourinary: Negative for dysuria, frequency and hematuria.  Musculoskeletal: Positive for arthralgias. Negative for back pain.  Skin: Negative.   Neurological: Negative for dizziness, seizures and speech difficulty.  Psychiatric/Behavioral: Negative for hallucinations and confusion.    Allergies  Aspirin  Home Medications   Current Outpatient Rx  Name  Route  Sig  Dispense  Refill  . albuterol (PROAIR HFA) 108 (90 BASE) MCG/ACT inhaler   Inhalation   Inhale 2 puffs into the lungs every 6 (six) hours as needed.   1 Inhaler   1   . amLODipine (NORVASC) 10 MG tablet   Oral   Take 10 mg by mouth daily.         Marland Kitchen aspirin EC 81 MG tablet   Oral   Take 162 mg by mouth daily.          . budesonide-formoterol (SYMBICORT) 160-4.5 MCG/ACT inhaler   Inhalation   Inhale 2 puffs into the lungs 2 (two) times daily.   1 Inhaler   0   . colesevelam (WELCHOL) 625 MG tablet   Oral   Take 1,875 mg by mouth 2 (two) times daily with a meal.           .  gabapentin (NEURONTIN) 300 MG capsule   Oral   Take 300 mg by mouth at bedtime.          Marland Kitchen LORazepam (ATIVAN) 1 MG tablet   Oral   Take 1 mg by mouth as needed for anxiety.          Marland Kitchen olmesartan (BENICAR) 20 MG tablet   Oral   Take 10 mg by mouth daily. One half daily         . rosuvastatin (CRESTOR) 5 MG tablet   Oral   Take 5 mg by mouth daily.          Marland Kitchen tiotropium (SPIRIVA HANDIHALER) 18 MCG inhalation capsule   Inhalation   Place 1 capsule (18 mcg total) into inhaler and inhale daily. Inhale contents of 1 capsule daily   10 capsule   0     BP 136/73  Pulse 100  Temp(Src) 97.9 F (36.6 C) (Oral)  Resp 17  Ht 5\' 9"  (1.753 m)  Wt 163 lb (73.936 kg)  BMI 24.06 kg/m2  SpO2 96%  Physical Exam  Nursing note and  vitals reviewed. Constitutional: He is oriented to person, place, and time. He appears well-developed and well-nourished.  Non-toxic appearance.  HENT:  Head: Normocephalic.  Right Ear: Tympanic membrane and external ear normal.  Left Ear: Tympanic membrane and external ear normal.  Eyes: EOM and lids are normal. Pupils are equal, round, and reactive to light.  Neck: Normal range of motion. Neck supple. Carotid bruit is not present.  Cardiovascular: Normal rate, regular rhythm, normal heart sounds, intact distal pulses and normal pulses.   Pulmonary/Chest: Breath sounds normal. No respiratory distress.  Abdominal: Soft. Bowel sounds are normal. There is no tenderness. There is no guarding.  Mild to moderate right lower abdomen pain to palpation in change of position. I do not feel a pulsating mass. No mass present.  Musculoskeletal: Normal range of motion.  There is pain to the lower lumbosacral area. There no hot areas appreciated. Is a well-healed surgical scar present. There is pain to change of position of the lower back.  There is pain with attempted range of motion of the right hip. There is no deformity present. No hot joints appreciated.  Lymphadenopathy:       Head (right side): No submandibular adenopathy present.       Head (left side): No submandibular adenopathy present.    He has no cervical adenopathy.  Neurological: He is alert and oriented to person, place, and time. He has normal strength. No cranial nerve deficit or sensory deficit.  Skin: Skin is warm and dry.  Psychiatric: He has a normal mood and affect. His speech is normal.    ED Course  Procedures (including critical care time)  Labs Reviewed  GLUCOSE, CAPILLARY - Abnormal; Notable for the following:    Glucose-Capillary 103 (*)    All other components within normal limits  BASIC METABOLIC PANEL - Abnormal; Notable for the following:    GFR calc non Af Amer 87 (*)    All other components within normal limits    Ct Angio Abd/pel W/ And/or W/o  04/20/2012  *RADIOLOGY REPORT*  Clinical Data:  Right hip, back and abdominal pain  CT ANGIOGRAPHY ABDOMEN AND PELVIS  Technique:  Multidetector CT imaging of the abdomen and pelvis was performed using the standard protocol during bolus administration of intravenous contrast.  Multiplanar reconstructed images including MIPs were obtained and reviewed to evaluate the vascular anatomy.  Contrast:  OMNIPAQUE IOHEXOL 350 MG/ML SOLN  Comparison:  01/31/2011  Findings:  Minimal bibasilar atelectasis versus scarring dependently.  No lower lobe pneumonia.  Normal heart size.  No pericardial or pleural effusion.  Abdomen:  Diffuse calcific atherosclerosis of the abdominal aorta without aneurysm, dissection, occlusive process, or retroperitoneal hemorrhage.  Celiac, SMA, single renal arteries, and small caliber IMA all appear patent.  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  Heavy calcification of the pelvic iliac vessels.  Left common iliac artery appears patent. The internal and external iliac arteries are heavily calcified and small in caliber but appear patent bilaterally.  The visualized common femoral, profunda femoral, and proximal superficial femoral arteries are also heavily calcified but patent without occlusion. No acute pelvic hemorrhage, fluid collection, hematoma, or abscess.  Nonvascular findings:  Probable tiny sub-centimeter gallstones noted dependently, image 39.  Liver, biliary system, pancreas, and spleen within normal limits for early arterial phase imaging. Stable left adrenal hypodense nodule measuring 11 mm, suspect small left adrenal adenoma.  Right adrenal gland appears normal.  Both kidneys demonstrate stable low density cysts, largest 11 mm in the left mid pole, image 52.  No renal obstruction or hydronephrosis.  Negative for bowel obstruction, dilatation, ileus pattern, or free air.  No  abdominal free fluid, fluid collection, adenopathy, or abscess.  Normal appendix in the right lower quadrant.  No acute distal bowel process.  No pelvic free fluid, fluid collection, abscess, adenopathy, inguinal abnormality, or hernia.  Prostate gland appears mildly enlarged.  Diffuse degenerative changes of the spine, pelvis and hips.   Review of the MIP images confirms the above findings.  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  Incidental tiny gallstones suspected  11 mm left adrenal nodule, suspect adenoma  Small incidental renal cysts   Original Report Authenticated By: Judie Petit. Miles Costain, M.D.      No diagnosis found.    MDM  I have reviewed nursing notes, vital signs, and all appropriate lab and imaging results for this patient. Patient has a history of degenerative disc disease and has required discectomy at L4-L5. The patient also has an aneurysm at the right iliac area. Patient appears very uncomfortable. The patient was transferred from the fast track area to the acute care area where IVs were started and the patient will receive a CT scan for evaluation of his aneurysm and for pain management concerning his back and hip.  The basic metabolic panel is well within normal limits. A CT abdomen/ pelvis with contrast angiogram reveals a 3.4 fusiform aneurysm of the right common iliac artery. This is stable when compared to previous evaluations. There is an incidental finding of a small gallstone been present, but no affect on the biliary tree. There is an 11 mm left adrenal 6 cyst or nodule and adenoma is suspected.  The plan at this time is for the patient to be treated with Decadron daily, Norco 7.5 mg, and patient will be referred back to his primary physician or specialist for management of his pain.   Kathie Dike, PA-C 04/20/12 1429

## 2012-04-20 NOTE — ED Notes (Signed)
Pt c/o right hip pain that became worse this am, denies any injury, has hx of having problems with right hip.

## 2012-04-20 NOTE — ED Provider Notes (Signed)
Medical screening examination/treatment/procedure(s) were conducted as a shared visit with non-physician practitioner(s) and myself.  I personally evaluated the patient during the encounter.  CT angiogram of abdomen pelvis reveal a stable right common iliac artery aneurysm.  No acute abdomen. Vital signs are stable.  Donnetta Hutching, MD 04/20/12 [phone]

## 2012-04-20 NOTE — ED Notes (Signed)
Hobson PA at bedside,  

## 2012-04-20 NOTE — ED Notes (Signed)
Pt c/o r hip pain radiating down r leg started this am. Hx of disc and sciatica.no meds for pain

## 2012-05-13 ENCOUNTER — Ambulatory Visit: Payer: Medicare Other | Admitting: Internal Medicine

## 2012-05-16 ENCOUNTER — Encounter: Payer: Self-pay | Admitting: Internal Medicine

## 2012-05-16 ENCOUNTER — Ambulatory Visit (INDEPENDENT_AMBULATORY_CARE_PROVIDER_SITE_OTHER): Payer: Medicare Other | Admitting: Internal Medicine

## 2012-05-16 VITALS — BP 110/60 | HR 94 | Temp 97.4°F | Ht 69.0 in | Wt 162.4 lb

## 2012-05-16 DIAGNOSIS — I10 Essential (primary) hypertension: Secondary | ICD-10-CM

## 2012-05-16 DIAGNOSIS — J449 Chronic obstructive pulmonary disease, unspecified: Secondary | ICD-10-CM

## 2012-05-16 DIAGNOSIS — F17200 Nicotine dependence, unspecified, uncomplicated: Secondary | ICD-10-CM

## 2012-05-16 NOTE — Progress Notes (Signed)
Subjective:    Patient ID: Jared Guerrero, male    DOB: 07-29-1945    MRN: [id number]  HPI  24 yowm smoker with dx of copd referred by Jared Guerrero 12/19/2011 to pulmonary clinic for refractory symptoms.   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  rec trial off acei    12/26/2011 Jared Guerrero pt. --pt reports symptoms remains since last visit 12/19/11 and are worsening-- ON last visit ACE was stopped & benicar substituted. -has had a couple of episodes of awakening at night w extreme SOB , using proair 4x per day and unsure whether to keep using if he has already done so, would like to know if pft can be done today  Spirometry showed moderate airway obstruction -fev1 48%, fvc 74%, ratio 51 rec You have moderate COPD -lung function at 48% Stay on spiriva Add symbicort 160- 2 puffs twice daily Use rescue inhaler every 6h  as needed  01/31/2012 f/u ov/Jared Guerrero cc breathing better to his satisfaction and not needing rescue inhaler more than twice daily at most "out of habit, not need".  rec Plan A is spiriva and symbicort 2 every 12 hours but as you improve you may be able to taper off the symbicort  Plan B proaire - only use if needed for short breath/ cough / wheeze The key is to stop smoking completely before smoking completely stops you - it's not too late  Benicar 20 mg one half daily   05/16/2012 f/u ov/Jared Guerrero re copd still smoking  Chief Complaint  Patient presents with  . Follow-up    Pt states breathing is doing well, no new co's today.   no limiting sob though not very active, not needing saba at all   No obvious daytime  variabilty or assoc chronic cough or cp or chest tightness, subjective wheeze overt sinus or hb symptoms. No unusual exp hx or h/o childhood pna/ asthma or premature birth to his knowledge.   Sleeping ok without nocturnal  or early am exacerbation  of respiratory  c/o's or need for noct saba. Also denies any obvious fluctuation of symptoms with weather or environmental changes or other aggravating or alleviating factors except as outlined above   ROS  The following are not active complaints unless bolded sore throat, dysphagia, dental problems, itching, sneezing,  nasal congestion or excess/ purulent secretions, ear ache,   fever, chills, sweats, unintended wt loss, pleuritic or exertional cp, hemoptysis,  orthopnea pnd or leg swelling, presyncope, palpitations, heartburn, abdominal pain, anorexia, nausea, vomiting, diarrhea  or change in bowel or urinary habits, change in stools or urine, dysuria,hematuria,  rash, arthralgias, visual complaints, headache, numbness weakness or ataxia or problems with walking or coordination,  change in mood/affect or memory.         Past Medical History  Diagnosis Date  . Diabetes mellitus   . Hyperlipidemia   . Hypertension   . Productive cough   . COPD (chronic obstructive pulmonary disease)   . Bronchitis   . AAA (abdominal aortic aneurysm)     Past Surgical History  Procedure Date  . Spine  surgery 1987    L4-5 diskectomy  . Foot surgery 2005    right foot    History   Social History  . Marital Status: Married    Spouse Name: N/A    Number of Children: N/A  . Years of Education: N/A   Occupational History  . Retired     office work   Social History Main Topics  . Smoking status: Current Every Day Smoker -- 1.0 packs/day for 35 years    Types: Cigarettes  . Smokeless tobacco: Never Used  . Alcohol Use: No  . Drug Use: No  . Sexually Active: Not on file   Other Topics Concern  . Not on file   Social History Narrative  . No  narrative on file      Objective:   Physical Exam 05/16/2012   162  Wt Readings from Last 3 Encounters:  01/31/12 163 lb (73.936 kg)  12/26/11 157 lb 3.2 oz (71.305 kg)  12/19/11 159 lb 12.8 oz (72.485 kg)    Gen. Pleasant, thin man, in no distress, normal affect ENT - no lesions, no post nasal drip Neck: No JVD, no thyromegaly, no carotid bruits Lungs: no use of accessory muscles, no dullness to percussion, decreased without rales or rhonchi  Cardiovascular: Rhythm regular, heart sounds  normal, no murmurs or gallops, no peripheral edema Abdomen: soft and non-tender, no hepatosplenomegaly, BS normal. Musculoskeletal: No deformities, no cyanosis or clubbing Neuro:  alert, non focal  11/13/11 cxr COPD with no acute abnormalities        Assessment & Plan:

## 2012-05-16 NOTE — Patient Instructions (Addendum)
Work on inhaler technique:  relax and gently blow all the way out then take a nice smooth deep breath back in, triggering the inhaler at same time you start breathing in.  Hold for up to 5 seconds if you can.  Rinse and gargle with water when done   If your mouth or throat starts to bother you,  I suggest you time the inhaler to your dental care and after using the inhaler(s) brush teeth and tongue with a baking soda containing toothpaste and when you rinse this out, gargle with it first to see if this helps your mouth and throat.     Spiriva daily and if any symptoms ok add symbcort 1-2 every 12 hours   Only use your albuterol (proaire)  as a rescue medication to be used if you can't catch your breath by resting or doing a relaxed purse lip breathing pattern. The less you use it, the better it will work when you need it.    If you are satisfied with your treatment plan let your doctor know and he/she can either refill your medications or you can return here when your prescription runs out.     If in any way you are not 100% satisfied,  please tell us.  If 100% better, tell your friends!

## 2012-05-20 NOTE — Assessment & Plan Note (Signed)
Trial off acei started 12/20/2011  > steady improvement in resp symptoms so would not rechallenge.

## 2012-05-20 NOTE — Assessment & Plan Note (Addendum)
-   PFT's 01/31/2012 FEV1  2.15 (73%)  With ratio 56 and no better with  and dlco 84%  The proper method of use, as well as anticipated side effects, of a metered-dose inhaler are discussed and demonstrated to the patient. Improved effectiveness after extensive coaching during this visit to a level of approximately  75% but 100% with dpi/spiriva  I had an extended summary discussion with the patient today lasting 15 to 20 minutes of a 25 minute visit on the following issues:   He only has mild copd at this point and won't progress if he stops smoking (discussed separately)  Clearly he's better off acei (see HBP) and although his hfa leaves a bit to be desired, he has not active symptoms at all whereas previously his symptoms were refractory.   He's doing so well and such well preserved fev1 he should just be able to use spiriva and add back the symbicort, which will start working 5 min after restarting, should he have any cough or sob.  Pulmonary f/u can be prn

## 2012-05-20 NOTE — Assessment & Plan Note (Signed)

## 2012-05-28 ENCOUNTER — Encounter: Payer: Self-pay | Admitting: Internal Medicine

## 2012-08-05 ENCOUNTER — Encounter: Payer: Self-pay | Admitting: Vascular Surgery

## 2012-08-06 ENCOUNTER — Encounter: Payer: Self-pay | Admitting: Vascular Surgery

## 2012-08-06 ENCOUNTER — Other Ambulatory Visit: Payer: Medicare Other

## 2012-08-06 ENCOUNTER — Ambulatory Visit (INDEPENDENT_AMBULATORY_CARE_PROVIDER_SITE_OTHER): Payer: Self-pay | Admitting: Vascular Surgery

## 2012-08-06 VITALS — BP 116/77 | HR 85 | Resp 16 | Ht 69.0 in | Wt 160.0 lb

## 2012-08-06 DIAGNOSIS — I723 Aneurysm of iliac artery: Secondary | ICD-10-CM

## 2012-08-06 DIAGNOSIS — I714 Abdominal aortic aneurysm, without rupture, unspecified: Secondary | ICD-10-CM

## 2012-08-06 NOTE — Addendum Note (Signed)
Addended by: Adria Dill L on: 08/06/2012 12:19 PM   Modules accepted: Orders

## 2012-08-06 NOTE — Progress Notes (Signed)
[facility]  Patient name: Jared Guerrero MRN: [id number] DOB: 1945-09-25 Sex: male  REASON FOR VISIT: follow up of right common iliac artery aneurysm.  HPI: Jared Guerrero is a 67 y.o. male I been following with a right common iliac artery aneurysm. In 2008 was 3.1 cm in maximum diameter. I last saw him in October 2013 when it was 3.5 cm in maximum diameter by duplex. I recommended a fall the study in 9 months. However, he developed some back pain and this prompted a CT scan which was done on 04/20/2012. This shows that the aneurysm was 3.4 cm in maximum diameter. There was no evidence of rupture. 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  Does continue to smoke half a pack per day.  Past Medical History  Diagnosis Date  . Diabetes mellitus   . Hyperlipidemia   . Hypertension   . Productive cough   . COPD (chronic obstructive pulmonary disease)   . Bronchitis   . AAA (abdominal aortic aneurysm)   . Adenomatous polyp   . CHF (congestive heart failure)    Family History  Problem Relation Age of Onset  . Heart disease Mother 61  . Hyperlipidemia Mother   . Heart attack Mother   . Heart attack Father 13  . Heart disease Father     before age 64  . Hyperlipidemia Father   . Aneurysm Maternal Grandfather   . Emphysema Father     was a smoker   SOCIAL HISTORY: History  Substance Use Topics  . Smoking status: Current Every Day Smoker -- 0.50 packs/day for 35 years    Types: Cigarettes  . Smokeless tobacco: Never Used  . Alcohol Use: No   Allergies  Allergen Reactions  . Aspirin     "sensitive" causes upset stomach  With higher dosage    Current Outpatient Prescriptions  Medication Sig Dispense Refill  . albuterol (PROAIR HFA) 108 (90 BASE) MCG/ACT inhaler Inhale 2 puffs into the lungs every 6 (six) hours as needed.  1 Inhaler  1  . amLODipine (NORVASC) 10 MG tablet Take 10 mg by mouth daily.       Marland Kitchen aspirin EC 81 MG tablet Take 162 mg by mouth daily.       . budesonide-formoterol (SYMBICORT) 160-4.5 MCG/ACT inhaler Inhale 1-2 puffs into the lungs at bedtime.      . colesevelam (WELCHOL) 625 MG tablet Take 1,875 mg by mouth 2 (two) times daily with a meal.        . gabapentin (NEURONTIN) 300 MG capsule Take 300 mg by mouth at bedtime.       Marland Kitchen HYDROcodone-acetaminophen (NORCO) 7.5-325 MG per tablet Take 1 tablet by mouth every 4 (four) hours as needed for pain.  20 tablet  0  . LORazepam (ATIVAN) 1 MG tablet Take 1 mg by mouth as needed for anxiety.       Marland Kitchen olmesartan (BENICAR) 20 MG tablet Take 10 mg by mouth daily. One half daily      . rosuvastatin (CRESTOR) 5 MG tablet Take 5 mg by mouth daily.       Marland Kitchen tiotropium (SPIRIVA HANDIHALER) 18 MCG inhalation capsule Place 1 capsule (18 mcg total) into inhaler and inhale daily. Inhale contents of 1 capsule daily  10 capsule  0  . cyclobenzaprine (FLEXERIL) 10 MG tablet Take 10 mg by mouth 3 (three) times daily as needed.  No current facility-administered medications for this visit.   REVIEW OF SYSTEMS: Arly.Keller ] denotes positive finding; [  ] denotes negative finding  CARDIOVASCULAR:  [ ]  chest pain   [ ]  chest pressure   [ ]  palpitations   [ ]  orthopnea   [ ]  dyspnea on exertion   [ ]  claudication   [ ]  rest pain   [ ]  DVT   [ ]  phlebitis PULMONARY:   Arly.Keller ] productive cough   Arly.Keller ] asthma   [ ]  wheezing NEUROLOGIC:   Arly.Keller ] weakness  Arly.Keller ] paresthesias  [ ]  aphasia  [ ]  amaurosis  [ ]  dizziness HEMATOLOGIC:   [ ]  bleeding problems   [ ]  clotting disorders MUSCULOSKELETAL:  [ ]  joint pain   [ ]  joint swelling [ ]  leg swelling GASTROINTESTINAL: [ ]   blood in stool  [ ]   hematemesis GENITOURINARY:  [ ]   dysuria  [ ]   hematuria PSYCHIATRIC:  [ ]  history of major depression INTEGUMENTARY:  [ ]  rashes  [ ]  ulcers CONSTITUTIONAL:  [ ]  fever   [ ]  chills  PHYSICAL EXAM: Filed Vitals:   08/06/12 1119  BP: 116/77  Pulse: 85  Resp: 16  Height: 5'  9" (1.753 m)  Weight: 160 lb (72.576 kg)  SpO2: 98%   Body mass index is 23.62 kg/(m^2). GENERAL: The patient is a well-nourished male, in no acute distress. The vital signs are documented above. CARDIOVASCULAR: There is a regular rate and rhythm. I do not detect carotid bruits. He has palpable femoral pulses, popliteal pulses, and dorsalis pedis pulses bilaterally. PULMONARY: There is good air exchange bilaterally without wheezing or rales. ABDOMEN: Soft and non-tender with normal pitched bowel sounds.  MUSCULOSKELETAL: There are no major deformities or cyanosis. NEUROLOGIC: No focal weakness or paresthesias are detected. SKIN: There are no ulcers or rashes noted. PSYCHIATRIC: The patient has a normal affect.  DATA:  I reviewed his CT scan which was done in March which shows that the maximum diameter of his right common iliac artery aneurysm is 3.4 cm. He has slight ectasia of the left common iliac artery. He does not have an abdominal aortic aneurysm.  MEDICAL ISSUES:  Aneurysm of iliac artery His right common iliac artery aneurysm measured 3.1 cm in 2008. On his most recent CT scan on 04/20/2012 it measured 3.4 cm. Thus it has not changed significantly in the last 6 years. Does have some chronic low back pain and is being worked up for this. I think it is safe to continue his follow up at 9 months and I'll have him return in early January for a duplex of his aorta and iliac arteries. We also again discussed the importance of tobacco cessation. He understands it continued tobacco use does increase his risk of aneurysm expansion and rupture. If the aneurysm did show evidence of enlargement in the future, he might potentially be a candidate for an EVAR.    Miyako Oelke S [facility] [place] Beeper: [phone]

## 2012-08-06 NOTE — Assessment & Plan Note (Signed)
His right common iliac artery aneurysm measured 3.1 cm in 2008. On his most recent CT scan on 04/20/2012 it measured 3.4 cm. Thus it has not changed significantly in the last 6 years. Does have some chronic low back pain and is being worked up for this. I think it is safe to continue his follow up at 9 months and I'll have him return in early January for a duplex of his aorta and iliac arteries. We also again discussed the importance of tobacco cessation. He understands it continued tobacco use does increase his risk of aneurysm expansion and rupture. If the aneurysm did show evidence of enlargement in the future, he might potentially be a candidate for an EVAR.

## 2012-10-16 ENCOUNTER — Telehealth: Payer: Self-pay | Admitting: Internal Medicine

## 2012-10-16 MED ORDER — BUDESONIDE-FORMOTEROL FUMARATE 160-4.5 MCG/ACT IN AERO: 1.0000 | INHALATION_SPRAY | Freq: Every day | RESPIRATORY_TRACT | Status: DC

## 2012-10-16 MED ORDER — TIOTROPIUM BROMIDE MONOHYDRATE 18 MCG IN CAPS: 18.0000 ug | ORAL_CAPSULE | Freq: Every day | RESPIRATORY_TRACT | Status: DC

## 2012-10-16 NOTE — Telephone Encounter (Signed)
I have placed a sample of each at front for pick up; wife is aware.

## 2013-01-14 ENCOUNTER — Emergency Department (HOSPITAL_COMMUNITY): Payer: Medicare Other

## 2013-01-14 ENCOUNTER — Emergency Department (HOSPITAL_COMMUNITY)
Admission: EM | Admit: 2013-01-14 | Discharge: 2013-01-14 | Disposition: A | Discharge status: Home / Self Care | Payer: Medicare Other | Attending: Emergency Medicine | Admitting: Emergency Medicine

## 2013-01-14 DIAGNOSIS — Z860100 Personal history of colon polyps, unspecified: Secondary | ICD-10-CM | POA: Insufficient documentation

## 2013-01-14 DIAGNOSIS — Z79899 Other long term (current) drug therapy: Secondary | ICD-10-CM | POA: Insufficient documentation

## 2013-01-14 DIAGNOSIS — IMO0002 Reserved for concepts with insufficient information to code with codable children: Secondary | ICD-10-CM | POA: Insufficient documentation

## 2013-01-14 DIAGNOSIS — E119 Type 2 diabetes mellitus without complications: Secondary | ICD-10-CM | POA: Insufficient documentation

## 2013-01-14 DIAGNOSIS — Z7982 Long term (current) use of aspirin: Secondary | ICD-10-CM | POA: Insufficient documentation

## 2013-01-14 DIAGNOSIS — I10 Essential (primary) hypertension: Secondary | ICD-10-CM | POA: Insufficient documentation

## 2013-01-14 DIAGNOSIS — J441 Chronic obstructive pulmonary disease with (acute) exacerbation: Secondary | ICD-10-CM | POA: Insufficient documentation

## 2013-01-14 DIAGNOSIS — F17200 Nicotine dependence, unspecified, uncomplicated: Secondary | ICD-10-CM | POA: Insufficient documentation

## 2013-01-14 DIAGNOSIS — I509 Heart failure, unspecified: Secondary | ICD-10-CM | POA: Insufficient documentation

## 2013-01-14 DIAGNOSIS — R05 Cough: Secondary | ICD-10-CM

## 2013-01-14 DIAGNOSIS — E785 Hyperlipidemia, unspecified: Secondary | ICD-10-CM | POA: Insufficient documentation

## 2013-01-14 MED ORDER — DOXYCYCLINE HYCLATE 100 MG PO TABS: 100.0000 mg | ORAL_TABLET | Freq: Two times a day (BID) | ORAL | Status: DC

## 2013-01-14 MED ORDER — ALBUTEROL SULFATE (5 MG/ML) 0.5% IN NEBU
5.0000 mg | INHALATION_SOLUTION | Freq: Once | RESPIRATORY_TRACT | Status: AC
  Administered 2013-01-14: 5 mg via RESPIRATORY_TRACT
  Filled 2013-01-14: qty 1

## 2013-01-14 MED ORDER — PREDNISONE 20 MG PO TABS: 40.0000 mg | ORAL_TABLET | Freq: Every day | ORAL | Status: DC

## 2013-01-14 MED ORDER — BENZONATATE 100 MG PO CAPS: 100.0000 mg | ORAL_CAPSULE | Freq: Three times a day (TID) | ORAL | Status: DC | PRN

## 2013-01-14 MED ORDER — PREDNISONE 50 MG PO TABS
60.0000 mg | ORAL_TABLET | Freq: Once | ORAL | Status: AC
  Administered 2013-01-14: 60 mg via ORAL
  Filled 2013-01-14 (×2): qty 1

## 2013-01-14 MED ORDER — IPRATROPIUM BROMIDE 0.02 % IN SOLN
0.5000 mg | Freq: Once | RESPIRATORY_TRACT | Status: AC
  Administered 2013-01-14: 0.5 mg via RESPIRATORY_TRACT
  Filled 2013-01-14: qty 2.5

## 2013-01-14 NOTE — ED Provider Notes (Signed)
CSN: [id number]     Arrival date & time 01/14/13  1046 History   First MD Initiated Contact with Patient 01/14/13 1313     Chief Complaint  Patient presents with  . Shortness of Breath  . Cough    HPI Pt was seen at 1355.  Per pt and his wife, c/o gradual onset and worsening of persistent cough, wheezing and SOB for the past 3 days.  Describes his symptoms as "my COPD is acting up."  Has been using home MDI with transient relief.  Has been associated with runny/stuffy nose and sinus congestion. Denies CP/palpitations, no back pain, no abd pain, no N/V/D, no fevers, no rash.     Past Medical History  Diagnosis Date  . Diabetes mellitus   . Hyperlipidemia   . Hypertension   . Productive cough   . COPD (chronic obstructive pulmonary disease)   . Bronchitis   . AAA (abdominal aortic aneurysm)   . Adenomatous polyp   . CHF (congestive heart failure)    Past Surgical History  Procedure Laterality Date  . Spine surgery  1987    L4-5 diskectomy  . Foot surgery  2005    right foot   . Colonoscopy  06/17/00    Dr. Elmer Ramp rectum, diminutive polyp in the sigmoid, flat polyp in the cecum, partially removed- adenomatous polyp  . Colonoscopy  09/16/00    Dr. Jena Gauss- normal appearing rectum, clot overlying a polypectomy site with oozing in the cecum , this lesion was treated, small cecal polyps= tubular adenoma   Family History  Problem Relation Age of Onset  . Heart disease Mother 53  . Hyperlipidemia Mother   . Heart attack Mother   . Heart attack Father 43  . Heart disease Father     before age 22  . Hyperlipidemia Father   . Aneurysm Maternal Grandfather   . Emphysema Father     was a smoker   History  Substance Use Topics  . Smoking status: Current Every Day Smoker -- 0.50 packs/day for 35 years    Types: Cigarettes  . Smokeless tobacco: Never Used  . Alcohol Use: No    Review of Systems ROS: Statement: All systems negative except as marked or noted in the HPI;  Constitutional: Negative for fever and chills. ; ; Eyes: Negative for eye pain, redness and discharge. ; ; ENMT: Negative for ear pain, hoarseness, sore throat. +nasal congestion, sinus pressure. ; ; Cardiovascular: Negative for chest pain, palpitations, diaphoresis, and peripheral edema. ; ; Respiratory: +cough, wheezing, SOB. Negative for stridor. ; ; Gastrointestinal: Negative for nausea, vomiting, diarrhea, abdominal pain, blood in stool, hematemesis, jaundice and rectal bleeding. . ; ; Genitourinary: Negative for dysuria, flank pain and hematuria. ; ; Musculoskeletal: Negative for back pain and neck pain. Negative for swelling and trauma.; ; Skin: Negative for pruritus, rash, abrasions, blisters, bruising and skin lesion.; ; Neuro: Negative for headache, lightheadedness and neck stiffness. Negative for weakness, altered level of consciousness , altered mental status, extremity weakness, paresthesias, involuntary movement, seizure and syncope.      Allergies  Aspirin  Home Medications   Current Outpatient Rx  Name  Route  Sig  Dispense  Refill  . albuterol (PROAIR HFA) 108 (90 BASE) MCG/ACT inhaler   Inhalation   Inhale 2 puffs into the lungs every 6 (six) hours as needed.   1 Inhaler   1   . amLODipine (NORVASC) 10 MG tablet   Oral   Take 10 mg  by mouth daily.         Marland Kitchen aspirin EC 81 MG tablet   Oral   Take 162 mg by mouth daily.          . budesonide-formoterol (SYMBICORT) 160-4.5 MCG/ACT inhaler   Inhalation   Inhale 1-2 puffs into the lungs at bedtime.   1 Inhaler   0   . colesevelam (WELCHOL) 625 MG tablet   Oral   Take 1,875 mg by mouth daily.          Marland Kitchen gabapentin (NEURONTIN) 300 MG capsule   Oral   Take 300 mg by mouth at bedtime.          Marland Kitchen LORazepam (ATIVAN) 1 MG tablet   Oral   Take 1 mg by mouth as needed for anxiety.          Marland Kitchen olmesartan (BENICAR) 20 MG tablet   Oral   Take 10 mg by mouth daily.          . rosuvastatin (CRESTOR) 10 MG  tablet   Oral   Take 5 mg by mouth daily.         Marland Kitchen tiotropium (SPIRIVA) 18 MCG inhalation capsule   Inhalation   Place 18 mcg into inhaler and inhale daily.          BP 106/73  Pulse 101  Temp(Src) 98.1 F (36.7 C) (Oral)  Resp 18  Ht 5\' 9"  (1.753 m)  Wt 160 lb (72.576 kg)  BMI 23.62 kg/m2  SpO2 91% Physical Exam 1400: Physical examination:  Nursing notes reviewed; Vital signs and O2 SAT reviewed;  Constitutional: Well developed, Well nourished, Well hydrated, In no acute distress; Head:  Normocephalic, atraumatic; Eyes: EOMI, PERRL, No scleral icterus; ENMT: TM's clear. +edemetous nasal turbinates bilat with clear rhinorrhea. Mouth and pharynx normal, Mucous membranes moist; Neck: Supple, Full range of motion, No lymphadenopathy; Cardiovascular: Regular rate and rhythm, No gallop; Respiratory: Breath sounds coarse & equal bilaterally, faint occasional scattered wheezes. No audible wheezing. No retrax or access mm use. Speaking full sentences with ease, Normal respiratory effort/excursion; Chest: Nontender, Movement normal; Abdomen: Soft, Nontender, Nondistended, Normal bowel sounds; Genitourinary: No CVA tenderness; Extremities: Pulses normal, No tenderness, No edema, No calf edema or asymmetry.; Neuro: AA&Ox3, Major CN grossly intact.  Speech clear. No gross focal motor or sensory deficits in extremities.; Skin: Color normal, Warm, Dry.   ED Course  Procedures    EKG Interpretation   None       MDM  MDM Reviewed: previous chart, nursing note and vitals Interpretation: x-ray   Dg Chest 2 View 01/14/2013   CLINICAL DATA:  Dyspnea and cough with history of COPD, CHF, and tobacco use.  EXAM: CHEST  2 VIEW  COMPARISON:  Chest x-ray of November 13, 2011.  FINDINGS: The lungs are hyperinflated with hemidiaphragm flattening. There is no focal infiltrate nor pleural effusion. The cardiopericardial silhouette is normal in size. The mediastinum is normal in width. The pulmonary  vascularity is not engorged. There is no pneumothorax. The observed portions of the bony thorax appear normal.  IMPRESSION: There is hyperinflation consistent with COPD. There is no evidence of pneumonia nor CHF.   Electronically Signed   By: David  [place]   On: 01/14/2013 11:43     1320:  Pt states he "feels better" after neb and steroid.  NAD, lungs CTA bilat, no wheezing, resps easy, speaking full sentences, Sats 100% R/A.  Pt has gotten himself dressed and is standing in  the hallway requesting to be discharged now. Will continue to tx for COPD exacerbation. Dx and testing d/w pt and family.  Questions answered.  Verb understanding, agreeable to d/c home with outpt f/u.    Laray Anger, DO 01/16/13 [phone]

## 2013-01-14 NOTE — ED Notes (Signed)
Patient reports: -cough and shortness of breath started three days ago -things progressed and were bad last night -he would have gone to his PCP but they are closed due to the holiday

## 2013-01-27 ENCOUNTER — Encounter: Payer: Self-pay | Admitting: Vascular Surgery

## 2013-01-28 ENCOUNTER — Encounter: Payer: Self-pay | Admitting: Vascular Surgery

## 2013-01-28 ENCOUNTER — Ambulatory Visit (HOSPITAL_COMMUNITY)
Admission: RE | Admit: 2013-01-28 | Discharge: 2013-01-28 | Disposition: A | Discharge status: Home / Self Care | Payer: Medicare Other | Source: Ambulatory Visit | Attending: Vascular Surgery | Admitting: Vascular Surgery

## 2013-01-28 ENCOUNTER — Ambulatory Visit (INDEPENDENT_AMBULATORY_CARE_PROVIDER_SITE_OTHER): Payer: Medicare Other | Admitting: Vascular Surgery

## 2013-01-28 VITALS — BP 98/70 | HR 75 | Ht 69.0 in | Wt 156.0 lb

## 2013-01-28 DIAGNOSIS — I714 Abdominal aortic aneurysm, without rupture, unspecified: Secondary | ICD-10-CM

## 2013-01-28 DIAGNOSIS — I723 Aneurysm of iliac artery: Secondary | ICD-10-CM | POA: Insufficient documentation

## 2013-01-28 NOTE — Addendum Note (Signed)
Addended by: Mena Goes on: 01/28/2013 05:57 PM   Modules accepted: Orders

## 2013-01-28 NOTE — Progress Notes (Signed)
Vascular and Vein Specialist of [facility]  Patient name: Jared Guerrero MRN: [id number] DOB: [birth year] Sex: male  REASON FOR VISIT: follow up of right common iliac artery aneurysm  HPI: Jared Guerrero is a 68 y.o. male who I last saw in July of 2014. 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 In 2008 it was 3.1 cm in maximum diameter. He had some back pain in March of last year which prompted a CT scan which showed that the aneurysm was 3.4 cm in maximum diameter. His back pain was related to his degenerative disc disease. He does not have a abdominal aortic aneurysm. He comes in for a routine follow up visit.  Since I saw him last, he denies any history of abdominal pain or back pain. Unfortunately he does continue to smoke. He smokes half a pack per day of cigarettes. In addition, his COPD he has been more bothersome lately with some occasional cough and shortness of breath.  Past Medical History  Diagnosis Date  . Diabetes mellitus   . Hyperlipidemia   . Hypertension   . Productive cough   . COPD (chronic obstructive pulmonary disease)   . Bronchitis   . AAA (abdominal aortic aneurysm)   . Adenomatous polyp   . CHF (congestive heart failure)    Family History  Problem Relation Age of Onset  . Heart disease Mother 65  . Hyperlipidemia Mother   . Heart attack Mother   . Hypertension Mother   . Heart attack Father 73  . Heart disease Father     before age 61  . Hyperlipidemia Father   . Emphysema Father     was a smoker  . Hypertension Father   . Aneurysm Maternal Grandfather    SOCIAL HISTORY: History  Substance Use Topics  . Smoking status: Current Every Day Smoker -- 0.50 packs/day for 35 years    Types: Cigarettes  . Smokeless tobacco: Never Used  . Alcohol Use: No   Allergies  Allergen Reactions  . Aspirin     "sensitive" causes upset stomach  With higher dosage    Current Outpatient Prescriptions  Medication Sig Dispense Refill  .  albuterol (PROAIR HFA) 108 (90 BASE) MCG/ACT inhaler Inhale 2 puffs into the lungs every 6 (six) hours as needed.  1 Inhaler  1  . amLODipine (NORVASC) 10 MG tablet Take 10 mg by mouth daily.      Marland Kitchen aspirin EC 81 MG tablet Take 162 mg by mouth daily.       . benzonatate (TESSALON) 100 MG capsule Take 1 capsule (100 mg total) by mouth 3 (three) times daily as needed for cough.  15 capsule  0  . budesonide-formoterol (SYMBICORT) 160-4.5 MCG/ACT inhaler Inhale 1-2 puffs into the lungs at bedtime.  1 Inhaler  0  . colesevelam (WELCHOL) 625 MG tablet Take 1,875 mg by mouth daily.       Marland Kitchen doxycycline (VIBRA-TABS) 100 MG tablet Take 1 tablet (100 mg total) by mouth 2 (two) times daily.  14 tablet  0  . gabapentin (NEURONTIN) 300 MG capsule Take 300 mg by mouth at bedtime.       Marland Kitchen levofloxacin (LEVAQUIN) 500 MG tablet Take 1 tablet by mouth daily.      Marland Kitchen LORazepam (ATIVAN) 1 MG tablet Take 1 mg by mouth as needed for anxiety.       Marland Kitchen olmesartan (BENICAR) 20 MG tablet Take 10 mg by mouth daily.       Marland Kitchen  predniSONE (DELTASONE) 20 MG tablet Take 2 tablets (40 mg total) by mouth daily.  10 tablet  0  . rosuvastatin (CRESTOR) 10 MG tablet Take 5 mg by mouth daily.      Marland Kitchen tiotropium (SPIRIVA) 18 MCG inhalation capsule Place 18 mcg into inhaler and inhale daily.       No current facility-administered medications for this visit.   REVIEW OF SYSTEMS: Valu.Nieves ] denotes positive finding; [  ] denotes negative finding  CARDIOVASCULAR:  [ ]  chest pain   [ ]  chest pressure   [ ]  palpitations   [ ]  orthopnea   [ ]  dyspnea on exertion   [ ]  claudication   [ ]  rest pain   [ ]  DVT   [ ]  phlebitis PULMONARY:   Valu.Nieves ] productive cough   [ ]  asthma   Valu.Nieves ] wheezing NEUROLOGIC:   [ ]  weakness  [ ]  paresthesias  [ ]  aphasia  [ ]  amaurosis  [ ]  dizziness HEMATOLOGIC:   [ ]  bleeding problems   [ ]  clotting disorders MUSCULOSKELETAL:  [ ]  joint pain   [ ]  joint swelling [ ]  leg swelling GASTROINTESTINAL: [ ]   blood in stool  [ ]    hematemesis GENITOURINARY:  [ ]   dysuria  [ ]   hematuria PSYCHIATRIC:  [ ]  history of major depression INTEGUMENTARY:  [ ]  rashes  [ ]  ulcers CONSTITUTIONAL:  [ ]  fever   [ ]  chills  PHYSICAL EXAM: Filed Vitals:   01/28/13 0904  BP: 98/70  Pulse: 75  Height: 5\' 9"  (1.753 m)  Weight: 156 lb (70.761 kg)  SpO2: 96%   Body mass index is 23.03 kg/(m^2). GENERAL: The patient is a well-nourished male, in no acute distress. The vital signs are documented above. CARDIOVASCULAR: There is a regular rate and rhythm. I do not detect carotid bruits. He has palpable femoral pulses. He has no significant lower extremity swelling. PULMONARY: There is good air exchange bilaterally without wheezing or rales. ABDOMEN: Soft and non-tender with normal pitched bowel sounds.  MUSCULOSKELETAL: There are no major deformities or cyanosis. NEUROLOGIC: No focal weakness or paresthesias are detected. SKIN: There are no ulcers or rashes noted. PSYCHIATRIC: The patient has a normal affect.  DATA:  I have independently interpreted his ultrasound of his aorta and iliacs. The largest diameter of his infrarenal aorta is 2.6 cm. She does not have an abdominal aortic aneurysm. His right common iliac artery measures 3.6 cm in maximum diameter which has increased 2 mm compared to the study 10 months ago. 6 years ago, the common iliac artery aneurysm was 3.1 cm. Thus it has only enlarged 5 mm in 6 years.  MEDICAL ISSUES:  Aneurysm of iliac artery His right common iliac artery aneurysm remained fairly stable in size. It has increased only 2 mm in the last 10 months. I recommended a follow up ultrasound in 6 months and I'll see him back at that time. If the aneurysm continues to gradually enlarge then we may need to ultimately consider elective repair. Based on his CT scan from March of 2014, it appears that he would be a candidate for EVAR.  The only, getting factor is a fairly short landing zone in the right common iliac  artery and also significant calcific disease. We have again discussed the importance of tobacco cessation.   [place] [facility] [place] Beeper: [phone]

## 2013-01-28 NOTE — Assessment & Plan Note (Signed)
His right common iliac artery aneurysm remained fairly stable in size. It has increased only 2 mm in the last 10 months. I recommended a follow up ultrasound in 6 months and I'll see him back at that time. If the aneurysm continues to gradually enlarge then we may need to ultimately consider elective repair. Based on his CT scan from March of 2014, it appears that he would be a candidate for EVAR.  The only, getting factor is a fairly short landing zone in the right common iliac artery and also significant calcific disease. We have again discussed the importance of tobacco cessation.

## 2013-03-27 ENCOUNTER — Other Ambulatory Visit: Payer: Self-pay | Admitting: Internal Medicine

## 2013-07-29 ENCOUNTER — Ambulatory Visit: Payer: Medicare Other | Admitting: Vascular Surgery

## 2013-07-29 ENCOUNTER — Other Ambulatory Visit (HOSPITAL_COMMUNITY): Payer: Medicare Other

## 2013-08-04 ENCOUNTER — Encounter: Payer: Self-pay | Admitting: Vascular Surgery

## 2013-08-05 ENCOUNTER — Encounter: Payer: Self-pay | Admitting: Vascular Surgery

## 2013-08-05 ENCOUNTER — Ambulatory Visit (HOSPITAL_COMMUNITY)
Admission: RE | Admit: 2013-08-05 | Discharge: 2013-08-05 | Disposition: A | Discharge status: Home / Self Care | Payer: Medicare Other | Source: Ambulatory Visit | Attending: Vascular Surgery | Admitting: Vascular Surgery

## 2013-08-05 ENCOUNTER — Ambulatory Visit (INDEPENDENT_AMBULATORY_CARE_PROVIDER_SITE_OTHER): Payer: Medicare Other | Admitting: Vascular Surgery

## 2013-08-05 VITALS — BP 104/52 | HR 72 | Resp 14 | Ht 69.0 in | Wt 153.0 lb

## 2013-08-05 DIAGNOSIS — I714 Abdominal aortic aneurysm, without rupture, unspecified: Secondary | ICD-10-CM

## 2013-08-05 DIAGNOSIS — I723 Aneurysm of iliac artery: Secondary | ICD-10-CM

## 2013-08-05 NOTE — Assessment & Plan Note (Signed)
His right common iliac artery measures 3.6 cm in maximum diameter. This has not changed since January. In 2008 was 3.1 cm. Thus this has been very stable in size over several years. I think is safe to do his next follow up study in one year and I have ordered a duplex scan for that time. We have again discussed the importance of tobacco cessation. He understands it continued tobacco use does increase his risk of aneurysm expansion. His blood pressure has been under good control. He is on statin. He is on aspirin.

## 2013-08-05 NOTE — Progress Notes (Signed)
Vascular and Vein Specialist of [facility]  Patient name: Jared Guerrero MRN: [id number] DOB: 04/18/1945 Sex: male  REASON FOR VISIT: Follow up of right common iliac artery aneurysm  HPI: Jared Guerrero is a 68 y.o. male who I last saw on 01/28/2013. In 2008 he was found to have a 3.1 cm right common iliac artery aneurysm. He does not have an abdominal aortic aneurysm. At the time of his last visit in January, the maximum diameter of the infrarenal aorta was 2.6 cm. The right common iliac artery measured 3.6 cm. He comes in for a routine follow up visit. He denies any history of abdominal pain or back pain. There have been no significant changes in his medical history. He does continue to smoke. His blood pressure has been under good control.  Past Medical History  Diagnosis Date  . Diabetes mellitus   . Hyperlipidemia   . Hypertension   . Productive cough   . COPD (chronic obstructive pulmonary disease)   . Bronchitis   . AAA (abdominal aortic aneurysm)   . Adenomatous polyp   . CHF (congestive heart failure)    Family History  Problem Relation Age of Onset  . Heart disease Mother 9  . Hyperlipidemia Mother   . Heart attack Mother   . Hypertension Mother   . Heart attack Father 62  . Heart disease Father     before age 83  . Hyperlipidemia Father   . Emphysema Father     was a smoker  . Hypertension Father   . Aneurysm Maternal Grandfather    SOCIAL HISTORY: History  Substance Use Topics  . Smoking status: Current Every Day Smoker -- 0.50 packs/day for 35 years    Types: Cigarettes  . Smokeless tobacco: Never Used  . Alcohol Use: No   Allergies  Allergen Reactions  . Aspirin     "sensitive" causes upset stomach  With higher dosage    Current Outpatient Prescriptions  Medication Sig Dispense Refill  . albuterol (PROAIR HFA) 108 (90 BASE) MCG/ACT inhaler Inhale 2 puffs into the lungs every 6 (six) hours as needed.  1 Inhaler  1  . amLODipine (NORVASC) 10  MG tablet Take 10 mg by mouth daily.      Marland Kitchen aspirin EC 81 MG tablet Take 162 mg by mouth daily.       . benzonatate (TESSALON) 100 MG capsule Take 1 capsule (100 mg total) by mouth 3 (three) times daily as needed for cough.  15 capsule  0  . budesonide-formoterol (SYMBICORT) 160-4.5 MCG/ACT inhaler Inhale 1-2 puffs into the lungs at bedtime.  1 Inhaler  0  . colesevelam (WELCHOL) 625 MG tablet Take 1,875 mg by mouth daily.       Marland Kitchen gabapentin (NEURONTIN) 300 MG capsule Take 300 mg by mouth at bedtime.       Marland Kitchen LORazepam (ATIVAN) 1 MG tablet Take 1 mg by mouth as needed for anxiety.       Marland Kitchen losartan (COZAAR) 50 MG tablet Take 50 mg by mouth daily.      . predniSONE (DELTASONE) 20 MG tablet Take 2 tablets (40 mg total) by mouth daily.  10 tablet  0  . rosuvastatin (CRESTOR) 10 MG tablet Take 5 mg by mouth daily.      Marland Kitchen tiotropium (SPIRIVA) 18 MCG inhalation capsule Place 18 mcg into inhaler and inhale daily.      Marland Kitchen doxycycline (VIBRA-TABS) 100 MG tablet Take 1 tablet (100 mg total)  by mouth 2 (two) times daily.  14 tablet  0  . levofloxacin (LEVAQUIN) 500 MG tablet Take 1 tablet by mouth daily.      Marland Kitchen olmesartan (BENICAR) 20 MG tablet Take 10 mg by mouth daily.        No current facility-administered medications for this visit.   REVIEW OF SYSTEMS: Valu.Nieves ] denotes positive finding; [  ] denotes negative finding  CARDIOVASCULAR:  [ ]  chest pain   [ ]  chest pressure   [ ]  palpitations   [ ]  orthopnea   [ ]  dyspnea on exertion   [ ]  claudication   [ ]  rest pain   [ ]  DVT   [ ]  phlebitis PULMONARY:   [ ]  productive cough   [ ]  asthma   Valu.Nieves ] wheezing NEUROLOGIC:   [ ]  weakness  [ ]  paresthesias  [ ]  aphasia  [ ]  amaurosis  [ ]  dizziness HEMATOLOGIC:   [ ]  bleeding problems   [ ]  clotting disorders MUSCULOSKELETAL:  [ ]  joint pain   [ ]  joint swelling [ ]  leg swelling GASTROINTESTINAL: [ ]   blood in stool  [ ]   hematemesis GENITOURINARY:  [ ]   dysuria  [ ]   hematuria PSYCHIATRIC:  [ ]  history of major  depression INTEGUMENTARY:  [ ]  rashes  [ ]  ulcers CONSTITUTIONAL:  [ ]  fever   [ ]  chills  PHYSICAL EXAM: Filed Vitals:   08/05/13 0900  BP: 104/52  Pulse: 72  Resp: 14  Height: 5\' 9"  (1.753 m)  Weight: 153 lb (69.4 kg)  SpO2: 98%   Body mass index is 22.58 kg/(m^2). GENERAL: The patient is a well-nourished male, in no acute distress. The vital signs are documented above. CARDIOVASCULAR: There is a regular rate and rhythm. I do not detect carotid bruits. He has palpable femoral and pedal pulses bilaterally. He has palpable popliteal pulses bilaterally which do not appear enlarged. PULMONARY: There is good air exchange bilaterally without wheezing or rales. ABDOMEN: Soft and non-tender with normal pitched bowel sounds. I do not palpate an abdominal aortic aneurysm. MUSCULOSKELETAL: There are no major deformities or cyanosis. NEUROLOGIC: No focal weakness or paresthesias are detected. SKIN: There are no ulcers or rashes noted. PSYCHIATRIC: The patient has a normal affect.  DATA:  I have independently interpreted his duplex study today which shows that the maximum diameter of his right common iliac artery aneurysm is 3.6 cm and is thus not changed compared to the study in January. The maximum diameter of his infrarenal aorta is 2.5 cm. Thus this is not aneurysmal.  MEDICAL ISSUES:  Iliac artery aneurysm His right common iliac artery measures 3.6 cm in maximum diameter. This has not changed since January. In 2008 was 3.1 cm. Thus this has been very stable in size over several years. I think is safe to do his next follow up study in one year and I have ordered a duplex scan for that time. We have again discussed the importance of tobacco cessation. He understands it continued tobacco use does increase his risk of aneurysm expansion. His blood pressure has been under good control. He is on statin. He is on aspirin.    Return in about 1 year (around 08/06/2014).  [place] [facility] Ve[facility] Specialists of [place] Beeper: [phone]

## 2013-08-06 NOTE — Addendum Note (Signed)
Addended by: Mena Goes on: 08/06/2013 09:53 AM   Modules accepted: Orders

## 2013-12-22 ENCOUNTER — Emergency Department (HOSPITAL_COMMUNITY)
Admission: EM | Admit: 2013-12-22 | Discharge: 2013-12-22 | Disposition: A | Discharge status: Home / Self Care | Payer: Medicare Other | Attending: Emergency Medicine | Admitting: Emergency Medicine

## 2013-12-22 ENCOUNTER — Encounter (HOSPITAL_COMMUNITY): Payer: Self-pay | Admitting: Emergency Medicine

## 2013-12-22 DIAGNOSIS — Z720 Tobacco use: Secondary | ICD-10-CM | POA: Diagnosis not present

## 2013-12-22 DIAGNOSIS — Z7982 Long term (current) use of aspirin: Secondary | ICD-10-CM | POA: Insufficient documentation

## 2013-12-22 DIAGNOSIS — I509 Heart failure, unspecified: Secondary | ICD-10-CM | POA: Insufficient documentation

## 2013-12-22 DIAGNOSIS — Z79899 Other long term (current) drug therapy: Secondary | ICD-10-CM | POA: Diagnosis not present

## 2013-12-22 DIAGNOSIS — J449 Chronic obstructive pulmonary disease, unspecified: Secondary | ICD-10-CM | POA: Insufficient documentation

## 2013-12-22 DIAGNOSIS — E785 Hyperlipidemia, unspecified: Secondary | ICD-10-CM | POA: Diagnosis not present

## 2013-12-22 DIAGNOSIS — Z791 Long term (current) use of non-steroidal anti-inflammatories (NSAID): Secondary | ICD-10-CM | POA: Diagnosis not present

## 2013-12-22 DIAGNOSIS — E119 Type 2 diabetes mellitus without complications: Secondary | ICD-10-CM | POA: Insufficient documentation

## 2013-12-22 DIAGNOSIS — Z792 Long term (current) use of antibiotics: Secondary | ICD-10-CM | POA: Diagnosis not present

## 2013-12-22 DIAGNOSIS — M542 Cervicalgia: Secondary | ICD-10-CM | POA: Diagnosis present

## 2013-12-22 DIAGNOSIS — I10 Essential (primary) hypertension: Secondary | ICD-10-CM | POA: Diagnosis not present

## 2013-12-22 DIAGNOSIS — Z86010 Personal history of colonic polyps: Secondary | ICD-10-CM | POA: Diagnosis not present

## 2013-12-22 DIAGNOSIS — Z7952 Long term (current) use of systemic steroids: Secondary | ICD-10-CM | POA: Insufficient documentation

## 2013-12-22 DIAGNOSIS — M436 Torticollis: Secondary | ICD-10-CM | POA: Diagnosis not present

## 2013-12-22 MED ORDER — HYDROCODONE-ACETAMINOPHEN 5-325 MG PO TABS
2.0000 | ORAL_TABLET | Freq: Once | ORAL | Status: AC
  Administered 2013-12-22: 2 via ORAL
  Filled 2013-12-22: qty 2

## 2013-12-22 MED ORDER — NAPROXEN 500 MG PO TABS: 500.0000 mg | ORAL_TABLET | Freq: Two times a day (BID) | ORAL | Status: DC

## 2013-12-22 MED ORDER — METHOCARBAMOL 500 MG PO TABS: 500.0000 mg | ORAL_TABLET | Freq: Two times a day (BID) | ORAL | Status: DC

## 2013-12-22 MED ORDER — CYCLOBENZAPRINE HCL 10 MG PO TABS
10.0000 mg | ORAL_TABLET | Freq: Once | ORAL | Status: AC
  Administered 2013-12-22: 10 mg via ORAL
  Filled 2013-12-22: qty 1

## 2013-12-22 MED ORDER — HYDROCODONE-ACETAMINOPHEN 5-325 MG PO TABS: 2.0000 | ORAL_TABLET | ORAL | Status: DC | PRN

## 2013-12-22 MED ORDER — DIAZEPAM 5 MG PO TABS: 5.0000 mg | ORAL_TABLET | Freq: Three times a day (TID) | ORAL | Status: DC | PRN

## 2013-12-22 NOTE — ED Notes (Signed)
Pt reports neck pain since yesterday. Pt denies any known injury/recent fall. Pt reports pain increases with movement. Pt denies any numbness,changes in vision. nad noted.

## 2013-12-22 NOTE — ED Provider Notes (Signed)
CSN: [id number]     Arrival date & time 12/22/13  1216 History  This chart was scribed for Tanna Furry, MD by Rayfield Citizen, ED Scribe. This patient was seen in room APA09/APA09 and the patient's care was started at 2:12 PM.    Chief Complaint  Patient presents with  . Neck Pain   The history is provided by the patient. No language interpreter was used.     HPI Comments: Jared Guerrero is a 68 y.o. male with past medical history of DM, HLD, HTN, COPD, CHF who presents to the Emergency Department complaining of neck pain, beginning yesterday after waking in the morning and gradually worsening throughout the past 24 hours. He explains that his pain feels like it originates in his spine and radiates outward; it is worse with movement. He reports that his grip strength and coordination are all normal. He denies numbness, tingling, or pain in his upper extremities. He denies trauma or injury. He denies a history of kidney problems. No treatments or medications tried PTA.  Past Medical History  Diagnosis Date  . Diabetes mellitus   . Hyperlipidemia   . Hypertension   . Productive cough   . COPD (chronic obstructive pulmonary disease)   . Bronchitis   . AAA (abdominal aortic aneurysm)   . Adenomatous polyp   . CHF (congestive heart failure)    Past Surgical History  Procedure Laterality Date  . Spine surgery  1987    L4-5 diskectomy  . Foot surgery  2005    right foot   . Colonoscopy  06/17/00    Dr. Vivi Ferns rectum, diminutive polyp in the sigmoid, flat polyp in the cecum, partially removed- adenomatous polyp  . Colonoscopy  09/16/00    Dr. Gala Romney- normal appearing rectum, clot overlying a polypectomy site with oozing in the cecum , this lesion was treated, small cecal polyps= tubular adenoma   Family History  Problem Relation Age of Onset  . Heart disease Mother 45  . Hyperlipidemia Mother   . Heart attack Mother   . Hypertension Mother   . Heart attack Father 79  . Heart  disease Father     before age 55  . Hyperlipidemia Father   . Emphysema Father     was a smoker  . Hypertension Father   . Aneurysm Maternal Grandfather    History  Substance Use Topics  . Smoking status: Current Every Day Smoker -- 0.50 packs/day for 35 years    Types: Cigarettes  . Smokeless tobacco: Never Used  . Alcohol Use: No    Review of Systems  Constitutional: Negative for fever, chills, diaphoresis, appetite change and fatigue.  HENT: Negative for mouth sores, sore throat and trouble swallowing.   Eyes: Negative for visual disturbance.  Respiratory: Negative for cough, chest tightness, shortness of breath and wheezing.   Cardiovascular: Negative for chest pain.  Gastrointestinal: Negative for nausea, vomiting, abdominal pain, diarrhea and abdominal distention.  Endocrine: Negative for polydipsia, polyphagia and polyuria.  Genitourinary: Negative for dysuria, frequency and hematuria.  Musculoskeletal: Positive for myalgias and neck pain. Negative for gait problem.  Skin: Negative for color change, pallor and rash.  Neurological: Negative for dizziness, syncope, light-headedness and headaches.  Hematological: Does not bruise/bleed easily.  Psychiatric/Behavioral: Negative for behavioral problems and confusion.    Allergies  Review of patient's allergies indicates no known allergies.  Home Medications   Prior to Admission medications   Medication Sig Start Date End Date Taking? Authorizing Provider  amLODipine (NORVASC) 10 MG tablet Take 10 mg by mouth daily.   Yes Historical Provider, MD  aspirin EC 81 MG tablet Take 81 mg by mouth 2 (two) times daily.    Yes Historical Provider, MD  budesonide-formoterol (SYMBICORT) 160-4.5 MCG/ACT inhaler Inhale 1-2 puffs into the lungs at bedtime. 10/16/12  Yes Tanda Rockers, MD  colesevelam [facility]) 625 MG tablet Take 1,875 mg by mouth daily.    Yes Historical Provider, MD  gabapentin (NEURONTIN) 300 MG capsule Take 300 mg by  mouth at bedtime.    Yes Historical Provider, MD  LORazepam (ATIVAN) 1 MG tablet Take 1 mg by mouth daily as needed for anxiety.    Yes Historical Provider, MD  losartan (COZAAR) 50 MG tablet Take 50 mg by mouth daily.   Yes Historical Provider, MD  rosuvastatin (CRESTOR) 10 MG tablet Take 5 mg by mouth daily.   Yes Historical Provider, MD  tiotropium (SPIRIVA) 18 MCG inhalation capsule Place 18 mcg into inhaler and inhale daily. 10/16/12  Yes Tanda Rockers, MD  albuterol (PROAIR HFA) 108 (90 BASE) MCG/ACT inhaler Inhale 2 puffs into the lungs every 6 (six) hours as needed. Patient taking differently: Inhale 2 puffs into the lungs every 6 (six) hours as needed for wheezing or shortness of breath.  01/10/12   Tanda Rockers, MD  benzonatate (TESSALON) 100 MG capsule Take 1 capsule (100 mg total) by mouth 3 (three) times daily as needed for cough. Patient not taking: Reported on 12/22/2013 01/14/13   Francine Graven, DO  diazepam (VALIUM) 5 MG tablet Take 1 tablet (5 mg total) by mouth every 8 (eight) hours as needed for anxiety (muscle spasm). 12/22/13   Tanna Furry, MD  doxycycline (VIBRA-TABS) 100 MG tablet Take 1 tablet (100 mg total) by mouth 2 (two) times daily. Patient not taking: Reported on 12/22/2013 01/14/13   Francine Graven, DO  HYDROcodone-acetaminophen (NORCO/VICODIN) 5-325 MG per tablet Take 2 tablets by mouth every 4 (four) hours as needed. 12/22/13   Tanna Furry, MD  methocarbamol (ROBAXIN) 500 MG tablet Take 1 tablet (500 mg total) by mouth 2 (two) times daily. 12/22/13   Tanna Furry, MD  naproxen (NAPROSYN) 500 MG tablet Take 1 tablet (500 mg total) by mouth 2 (two) times daily. 12/22/13   Tanna Furry, MD  predniSONE (DELTASONE) 20 MG tablet Take 2 tablets (40 mg total) by mouth daily. Patient not taking: Reported on 12/22/2013 01/14/13   Francine Graven, DO   BP 138/72 mmHg  Pulse 109  Temp(Src) 97.7 F (36.5 C) (Oral)  Resp 20  Ht 5\' 8"  (1.727 m)  Wt 156 lb (70.761 kg)  BMI 23.73  kg/m2  SpO2 97% Physical Exam  Constitutional: He is oriented to person, place, and time. He appears well-developed and well-nourished. No distress.  HENT:  Head: Normocephalic.  Eyes: Conjunctivae are normal. Pupils are equal, round, and reactive to light. No scleral icterus.  Neck: Normal range of motion. Neck supple. No thyromegaly present.  Midline lower neck pain and radiating out the top of both shoulders Negative Spurlings, negative Lhermittes   Cardiovascular: Normal rate and regular rhythm.  Exam reveals no gallop and no friction rub.   No murmur heard. Pulmonary/Chest: Effort normal and breath sounds normal. No respiratory distress. He has no wheezes. He has no rales.  Abdominal: Soft. Bowel sounds are normal. He exhibits no distension. There is no tenderness. There is no rebound.  Musculoskeletal: Normal range of motion.  Neurological: He is alert  and oriented to person, place, and time.  Skin: Skin is warm and dry. No rash noted.  Psychiatric: He has a normal mood and affect. His behavior is normal.    ED Course  Procedures   DIAGNOSTIC STUDIES: Oxygen Saturation is 97% on RA, adequate by my interpretation.    COORDINATION OF CARE: 2:20 PM Discussed treatment plan with pt at bedside and pt agreed to plan.   Labs Review Labs Reviewed - No data to display  Imaging Review No results found.   EKG Interpretation None      MDM   Final diagnoses:  Torticollis   I personally performed the services described in this documentation, which was scribed in my presence. The recorded information has been reviewed and is accurate.      Tanna Furry, MD 12/22/13 [phone]

## 2013-12-22 NOTE — Discharge Instructions (Signed)
Return to ER with any pain, numbness, or weakness radiating to arms or legs. Recheck with your primary care physician if symptoms are not slowly improving.  Torticollis, Acute You have suddenly (acutely) developed a twisted neck (torticollis). This is usually a self-limited condition. CAUSES  Acute torticollis may be caused by malposition, trauma or infection. Most commonly, acute torticollis is caused by sleeping in an awkward position. Torticollis may also be caused by the flexion, extension or twisting of the neck muscles beyond their normal position. Sometimes, the exact cause may not be known. SYMPTOMS  Usually, there is pain and limited movement of the neck. Your neck may twist to one side. DIAGNOSIS  The diagnosis is often made by physical examination. X-rays, CT scans or MRIs may be done if there is a history of trauma or concern of infection. TREATMENT  For a common, stiff neck that develops during sleep, treatment is focused on relaxing the contracted neck muscle. Medications (including shots) may be used to treat the problem. Most cases resolve in several days. Torticollis usually responds to conservative physical therapy. If left untreated, the shortened and spastic neck muscle can cause deformities in the face and neck. Rarely, surgery is required. HOME CARE INSTRUCTIONS   Use over-the-counter and prescription medications as directed by your caregiver.  Do stretching exercises and massage the neck as directed by your caregiver.  Follow up with physical therapy if needed and as directed by your caregiver. SEEK IMMEDIATE MEDICAL CARE IF:   You develop difficulty breathing or noisy breathing (stridor).  You drool, develop trouble swallowing or have pain with swallowing.  You develop numbness or weakness in the hands or feet.  You have changes in speech or vision.  You have problems with urination or bowel movements.  You have difficulty walking.  You have a fever.  You  have increased pain. MAKE SURE YOU:   Understand these instructions.  Will watch your condition.  Will get help right away if you are not doing well or get worse. Document Released: 01/06/2000 Document Revised: 04/02/2011 Document Reviewed: 02/16/2009 [facility] Patient Information 2015 [place], [place]. This information is not intended to replace advice given to you by your health care provider. Make sure you discuss any questions you have with your health care provider.

## 2013-12-22 NOTE — ED Notes (Signed)
c-collar placed in triage

## 2014-03-19 ENCOUNTER — Other Ambulatory Visit (HOSPITAL_COMMUNITY): Payer: Self-pay | Admitting: Family Medicine

## 2014-03-19 ENCOUNTER — Ambulatory Visit (HOSPITAL_COMMUNITY)
Admission: RE | Admit: 2014-03-19 | Discharge: 2014-03-19 | Disposition: A | Discharge status: Home / Self Care | Payer: Medicare Other | Source: Ambulatory Visit | Attending: Family Medicine | Admitting: Family Medicine

## 2014-03-19 DIAGNOSIS — R05 Cough: Secondary | ICD-10-CM | POA: Diagnosis present

## 2014-03-19 DIAGNOSIS — J449 Chronic obstructive pulmonary disease, unspecified: Secondary | ICD-10-CM

## 2014-03-19 DIAGNOSIS — J069 Acute upper respiratory infection, unspecified: Secondary | ICD-10-CM

## 2014-03-19 DIAGNOSIS — R0989 Other specified symptoms and signs involving the circulatory and respiratory systems: Secondary | ICD-10-CM | POA: Diagnosis not present

## 2014-08-11 ENCOUNTER — Other Ambulatory Visit (HOSPITAL_COMMUNITY): Payer: Medicare Other

## 2014-08-11 ENCOUNTER — Ambulatory Visit: Payer: Medicare Other | Admitting: Vascular Surgery

## 2014-08-25 ENCOUNTER — Other Ambulatory Visit (HOSPITAL_COMMUNITY): Payer: Medicare Other

## 2014-08-25 ENCOUNTER — Ambulatory Visit: Payer: Medicare Other | Admitting: Vascular Surgery

## 2014-09-16 ENCOUNTER — Encounter: Payer: Self-pay | Admitting: Vascular Surgery

## 2014-09-17 ENCOUNTER — Ambulatory Visit (INDEPENDENT_AMBULATORY_CARE_PROVIDER_SITE_OTHER): Payer: Medicare Other | Admitting: Vascular Surgery

## 2014-09-17 ENCOUNTER — Encounter: Payer: Self-pay | Admitting: Vascular Surgery

## 2014-09-17 ENCOUNTER — Ambulatory Visit (HOSPITAL_COMMUNITY)
Admission: RE | Admit: 2014-09-17 | Discharge: 2014-09-17 | Disposition: A | Discharge status: Home / Self Care | Payer: Medicare Other | Source: Ambulatory Visit | Attending: Vascular Surgery | Admitting: Vascular Surgery

## 2014-09-17 VITALS — BP 131/83 | HR 81 | Temp 97.0°F | Resp 16 | Ht 68.0 in | Wt 153.0 lb

## 2014-09-17 DIAGNOSIS — I723 Aneurysm of iliac artery: Secondary | ICD-10-CM

## 2014-09-17 DIAGNOSIS — I714 Abdominal aortic aneurysm, without rupture, unspecified: Secondary | ICD-10-CM

## 2014-09-17 NOTE — Progress Notes (Signed)
Vascular and Vein Specialist of [facility]  Patient name: Jared Guerrero MRN: [id number] DOB: [birth year] Sex: male  REASON FOR VISIT: Follow up of right common iliac artery aneurysm  HPI: Jared Guerrero is a 69 y.o. male I been following with a right common iliac artery aneurysm. I last saw him on 08/05/2013. In 2008 he was found to have a 3.1 cm right common iliac artery aneurysm. He did not have an abdominal aortic aneurysm. At the time of his last visit the maximum diameter of the right common iliac artery was 3.6 cm and thus had not changed in 6 months. The maximum diameter of the infrarenal aorta was only 2.5 cm and thus was not truly aneurysmal.  Since I saw him last, he denies any abdominal pain or back pain. His blood pressure has been well-controlled. He does continue to smoke 1 pack per day of cigarettes. Denies claudication or rest pain. His biggest issue is his shortness of breath which limits his activity.  Past Medical History  Diagnosis Date  . Diabetes mellitus   . Hyperlipidemia   . Hypertension   . Productive cough   . COPD (chronic obstructive pulmonary disease)   . Bronchitis   . AAA (abdominal aortic aneurysm)   . Adenomatous polyp   . CHF (congestive heart failure)    Family History  Problem Relation Age of Onset  . Heart disease Mother 10  . Hyperlipidemia Mother   . Heart attack Mother   . Hypertension Mother   . Heart attack Father 52  . Heart disease Father     before age 18  . Hyperlipidemia Father   . Emphysema Father     was a smoker  . Hypertension Father   . Aneurysm Maternal Grandfather    SOCIAL HISTORY: Social History  Substance Use Topics  . Smoking status: Current Every Day Smoker -- 0.50 packs/day for 35 years    Types: Cigarettes  . Smokeless tobacco: Never Used  . Alcohol Use: No   No Active Allergies Current Outpatient Prescriptions  Medication Sig Dispense Refill  . albuterol (PROAIR HFA) 108 (90 BASE) MCG/ACT inhaler  Inhale 2 puffs into the lungs every 6 (six) hours as needed. (Patient taking differently: Inhale 2 puffs into the lungs every 6 (six) hours as needed for wheezing or shortness of breath. ) 1 Inhaler 1  . amLODipine (NORVASC) 10 MG tablet Take 10 mg by mouth daily.    Marland Kitchen aspirin EC 81 MG tablet Take 81 mg by mouth 2 (two) times daily.     . benzonatate (TESSALON) 100 MG capsule Take 1 capsule (100 mg total) by mouth 3 (three) times daily as needed for cough. (Patient not taking: Reported on 12/22/2013) 15 capsule 0  . budesonide-formoterol (SYMBICORT) 160-4.5 MCG/ACT inhaler Inhale 1-2 puffs into the lungs at bedtime. 1 Inhaler 0  . colesevelam (WELCHOL) 625 MG tablet Take 1,875 mg by mouth daily.     . diazepam (VALIUM) 5 MG tablet Take 1 tablet (5 mg total) by mouth every 8 (eight) hours as needed for anxiety (muscle spasm). 10 tablet 0  . doxycycline (VIBRA-TABS) 100 MG tablet Take 1 tablet (100 mg total) by mouth 2 (two) times daily. (Patient not taking: Reported on 12/22/2013) 14 tablet 0  . gabapentin (NEURONTIN) 300 MG capsule Take 300 mg by mouth at bedtime.     Marland Kitchen HYDROcodone-acetaminophen (NORCO/VICODIN) 5-325 MG per tablet Take 2 tablets by mouth every 4 (four) hours as needed. 20 tablet  0  . LORazepam (ATIVAN) 1 MG tablet Take 1 mg by mouth daily as needed for anxiety.     Marland Kitchen losartan (COZAAR) 50 MG tablet Take 50 mg by mouth daily.    . methocarbamol (ROBAXIN) 500 MG tablet Take 1 tablet (500 mg total) by mouth 2 (two) times daily. 20 tablet 0  . naproxen (NAPROSYN) 500 MG tablet Take 1 tablet (500 mg total) by mouth 2 (two) times daily. 30 tablet 0  . predniSONE (DELTASONE) 20 MG tablet Take 2 tablets (40 mg total) by mouth daily. (Patient not taking: Reported on 12/22/2013) 10 tablet 0  . rosuvastatin (CRESTOR) 10 MG tablet Take 5 mg by mouth daily.    Marland Kitchen tiotropium (SPIRIVA) 18 MCG inhalation capsule Place 18 mcg into inhaler and inhale daily.     No current facility-administered  medications for this visit.   REVIEW OF SYSTEMS: Valu.Nieves ] denotes positive finding; [  ] denotes negative finding  CARDIOVASCULAR:  [ ]  chest pain   [ ]  chest pressure   [ ]  palpitations   [ ]  orthopnea   Valu.Nieves ] dyspnea on exertion   [ ]  claudication   [ ]  rest pain   [ ]  DVT   [ ]  phlebitis PULMONARY:   Valu.Nieves ] productive cough   [ ]  asthma   Valu.Nieves ] wheezing NEUROLOGIC:   [ ]  weakness  [ ]  paresthesias  [ ]  aphasia  [ ]  amaurosis  [ ]  dizziness HEMATOLOGIC:   [ ]  bleeding problems   [ ]  clotting disorders MUSCULOSKELETAL:  [ ]  joint pain   [ ]  joint swelling [ ]  leg swelling GASTROINTESTINAL: [ ]   blood in stool  [ ]   hematemesis GENITOURINARY:  [ ]   dysuria  [ ]   hematuria PSYCHIATRIC:  [ ]  history of major depression INTEGUMENTARY:  [ ]  rashes  [ ]  ulcers CONSTITUTIONAL:  [ ]  fever   [ ]  chills  PHYSICAL EXAM: Filed Vitals:   09/17/14 0913  BP: 131/83  Pulse: 81  Temp: 97 F (36.1 C)  Resp: 16  Height: 5\' 8"  (1.727 m)  Weight: 153 lb (69.4 kg)  SpO2: 96%   GENERAL: The patient is a well-nourished male, in no acute distress. The vital signs are documented above. CARDIAC: There is a regular rate and rhythm.  VASCULAR: I do not detect carotid bruits. He has palpable femoral, popliteal, and pedal pulses bilaterally. PULMONARY: There is good air exchange bilaterally without wheezing or rales. ABDOMEN: Soft and non-tender with normal pitched bowel sounds. I cannot palpate his right common iliac artery aneurysm. MUSCULOSKELETAL: There are no major deformities or cyanosis. NEUROLOGIC: No focal weakness or paresthesias are detected. SKIN: There are no ulcers or rashes noted. PSYCHIATRIC: The patient has a normal affect.  DATA:  I have independently interpreted his duplex of his aorta and common iliac arteries today. The maximum diameter of his infrarenal aorta is 2.28 cm. The maximum diameter of his right common iliac artery is 3.54 cm. This has not changed compared to the study in July 2015.  The left common iliac artery measures 1.2 cm in maximum diameter.  MEDICAL ISSUES:  RIGHT COMMON ILIAC ARTERY ANEURYSM: His right common iliac artery aneurysm has not changed in the last year. It has been relatively stable in size since 2008. For this reason, I think it is safe to continue follow up studies at 1 year. I have ordered a follow up duplex scan in 1 year and I'll see him  back at that time. He knows to call sooner if he has problems. We have discussed the importance of tobacco cessation. His blood pressure is well controlled. He knows to call sooner if he has problems.   Of note, I did discuss with him considering pulmonary rehabilitation given his limitations related to shortness of breath. We also again discussed the importance of tobacco cessation.  Return in about 1 year (around 09/17/2015).   [name] [facility] [place]: [phone]

## 2014-09-17 NOTE — Addendum Note (Signed)
Addended by: Dorthula Rue L on: 09/17/2014 09:58 AM   Modules accepted: Orders

## 2014-12-10 ENCOUNTER — Ambulatory Visit (INDEPENDENT_AMBULATORY_CARE_PROVIDER_SITE_OTHER): Payer: Medicare Other | Admitting: Internal Medicine

## 2014-12-10 ENCOUNTER — Ambulatory Visit (INDEPENDENT_AMBULATORY_CARE_PROVIDER_SITE_OTHER)
Admission: RE | Admit: 2014-12-10 | Discharge: 2014-12-10 | Disposition: A | Discharge status: Home / Self Care | Payer: Medicare Other | Source: Ambulatory Visit | Attending: Internal Medicine | Admitting: Internal Medicine

## 2014-12-10 ENCOUNTER — Encounter: Payer: Self-pay | Admitting: Internal Medicine

## 2014-12-10 VITALS — BP 116/76 | HR 100 | Ht 68.0 in | Wt 155.8 lb

## 2014-12-10 DIAGNOSIS — J449 Chronic obstructive pulmonary disease, unspecified: Secondary | ICD-10-CM | POA: Diagnosis not present

## 2014-12-10 DIAGNOSIS — Z720 Tobacco use: Secondary | ICD-10-CM | POA: Diagnosis not present

## 2014-12-10 DIAGNOSIS — F17210 Nicotine dependence, cigarettes, uncomplicated: Secondary | ICD-10-CM

## 2014-12-10 MED ORDER — PREDNISONE 10 MG PO TABS: ORAL_TABLET | ORAL | Status: DC

## 2014-12-10 MED ORDER — TIOTROPIUM BROMIDE MONOHYDRATE 2.5 MCG/ACT IN AERS: INHALATION_SPRAY | RESPIRATORY_TRACT | Status: DC

## 2014-12-10 MED ORDER — AZITHROMYCIN 250 MG PO TABS: ORAL_TABLET | ORAL | Status: DC

## 2014-12-10 NOTE — Progress Notes (Signed)
Quick Note:  ATC line busy, WCB ______

## 2014-12-10 NOTE — Patient Instructions (Signed)
zpak and Prednisone 10 mg take  4 each am x 2 days,   2 each am x 2 days,  1 each am x 2 days and stop   Change spiriva to respimat - two puff each am  Work on inhaler technique:  relax and gently blow all the way out then take a nice smooth deep breath back in, triggering the inhaler at same time you start breathing in.  Hold for up to 5 seconds if you can. Blow out thru nose. Rinse and gargle with water when done  Please remember to go to the x-ray department downstairs for your tests - we will call you with the results when they are available.

## 2014-12-10 NOTE — Progress Notes (Signed)
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  rec trial off acei    12/26/2011 Barnet Pall [name]. --pt reports symptoms remains since last visit 12/19/11 and are worsening-- ON last visit ACE was stopped & benicar substituted. -has had a couple of episodes of awakening at night w extreme SOB , using proair 4x per day and unsure whether to keep using if he has already done so, would like to know if pft can be done today  Spirometry showed moderate airway obstruction -fev1 48%, fvc 74%, ratio 51 rec You have moderate COPD -lung function at 48% Stay on spiriva Add symbicort 160- 2 puffs twice daily Use rescue inhaler every 6h  as needed  01/31/2012 f/u ov/Kingston Shawgo cc breathing better to his satisfaction and not needing rescue inhaler more than twice daily at most "out of habit, not need".  rec Plan A is spiriva and symbicort 2 every 12 hours but as you improve you may be able to taper off the symbicort  Plan B proaire - only use if needed for short breath/ cough / wheeze The key is to stop smoking completely before smoking completely stops you - it's not too late  Benicar 20 mg one half daily   05/16/2012 f/u ov/Adewale Pucillo re copd still smoking  Chief Complaint  Patient presents with  . Follow-up    Pt states breathing is doing well, no new co's  today.   no limiting sob though not very active, not needing saba at all rec Work on inhaler technique:   Spiriva daily and if any symptoms ok add symbcort 1-2 every 12 hours       12/10/2014   ov/Aydden Cumpian re:  aecopd in active smoker with GOLD II criteria at baseline maint on spiriva and symbicort  Chief Complaint  Patient presents with  . Acute Visit    Pt c/o increased cough for the past few months. Cough is prod with greyish white sputum. He has noticed he coughs more when he lies down. His breathing  has been progressively worse and he has been using albuterol HFA 3 x daily on average.    baseline not using saba at all then gradually worse x sev months with doe x adls Cough worse first thing in the morning and takes up to an hour to clear it  No obvious daytime variabilty or assoc chronic   cp or chest tightness, subjective wheeze overt sinus or hb symptoms. No unusual exp hx or h/o childhood pna/ asthma or premature birth to his knowledge.   Sleeping ok without nocturnal  or early am exacerbation  of respiratory  c/o's or need for noct saba. Also denies any obvious fluctuation of symptoms with weather or environmental changes or other aggravating or alleviating factors except  as outlined above   ROS  The following are not active complaints unless bolded sore throat, dysphagia, dental problems, itching, sneezing,  nasal congestion or excess/ purulent secretions, ear ache,   fever, chills, sweats, unintended wt loss, pleuritic or exertional cp, hemoptysis,  orthopnea pnd or leg swelling, presyncope, palpitations, heartburn, abdominal pain, anorexia, nausea, vomiting, diarrhea  or change in bowel or urinary habits, change in stools or urine, dysuria,hematuria,  rash, arthralgias, visual complaints, headache, numbness weakness or ataxia or problems with walking or coordination,  change in mood/affect or memory.         Objective:   Physical Exam   05/16/2012   162 > 12/10/2014 156      01/31/12 163 lb (73.936 kg)  12/26/11 157 lb 3.2 oz (71.305 kg)  12/19/11 159 lb 12.8 oz (72.485 kg)    Gen. Pleasant, thin man, in no distress, normal affect ENT - no lesions, no post nasal drip Neck: No JVD, no thyromegaly, no carotid bruits Lungs: no use of accessory muscles, bilaterally hyperresonant to percussion, decreased bs with insp / exp rhonchi bilaterally  Cardiovascular: Rhythm regular, heart sounds  normal, no murmurs or gallops, no peripheral edema Abdomen: soft and non-tender, no hepatosplenomegaly, BS normal. Musculoskeletal: No deformities, no cyanosis or clubbing Neuro:  alert, non focal      CXR PA and Lateral:   12/10/2014 :    I personally reviewed images and agree with radiology impression as follows:    No active cardiopulmonary disease. Emphysema.    Assessment & Plan:

## 2014-12-11 NOTE — Assessment & Plan Note (Signed)
Discussed the risks and costs (both direct and indirect)  of smoking relative to the benefits of quitting but patient unwilling to commit at this point to a specific quit date.    Offered to help with quitting by use of chantix or referral to our Quit Smart program when the patient is ready.  

## 2014-12-11 NOTE — Assessment & Plan Note (Addendum)
-   PFT's 01/31/2012 FEV1  2.15 (73%)  With ratio 56 and no better with  and dlco 84% - 12/10/2014  extensive coaching HFA effectiveness =    90% > try respimat  Acute / chronic symptoms difficult to control.  DDX of  difficult airways management all start with A and  include Adherence, Ace Inhibitors, Acid Reflux, Active Sinus Disease, Alpha 1 Antitripsin deficiency, Anxiety masquerading as Airways dz,  ABPA,  allergy(esp in young), Aspiration (esp in elderly), Adverse effects of meds,  Active smokers, A bunch of PE's (a small clot burden can't cause this syndrome unless there is already severe underlying pulm or vascular dz with poor reserve) plus two Bs  = Bronchiectasis and Beta blocker use..and one C= CHF   Adherence is always the initial "prime suspect" and is a multilayered concern that requires a "trust but verify" approach in every patient - starting with knowing how to use medications, especially inhalers, correctly, keeping up with refills and understanding the fundamental difference between maintenance and prns vs those medications only taken for a very short course and then stopped and not refilled.  The proper method of use, as well as anticipated side effects, of a metered-dose inhaler are discussed and demonstrated to the patient. Improved effectiveness after extensive coaching during this visit to a level of approximately  90% so try change spiriva to respimat  Active smoking the other obvious issue > see sep a/p   ? Adverse effect to dpi > change to Spiriva rest on that so that he is only having to learn one single technique for all 3 of his inhalers.  ? Allergy> Prednisone 10 mg take  4 each am x 2 days,   2 each am x 2 days,  1 each am x 2 days and stop   Will add zpak also for ae chronic bronchitis  I had an extended discussion with the patient reviewing all relevant studies completed to date and  lasting 15 to 20 minutes of a 25 minute visit    Each maintenance medication was  reviewed in detail including most importantly the difference between maintenance and prns and under what circumstances the prns are to be triggered using an action plan format that is not reflected in the computer generated alphabetically organized AVS.    Please see instructions for details which were reviewed in writing and the patient given a copy highlighting the part that I personally wrote and discussed at today's ov.

## 2014-12-13 ENCOUNTER — Telehealth: Payer: Self-pay | Admitting: Internal Medicine

## 2014-12-13 NOTE — Telephone Encounter (Signed)
States she no longer needs a call back, nothing further needed

## 2014-12-13 NOTE — Progress Notes (Signed)
Quick Note:  Spoke with pt and notified of results per Dr. Wert. Pt verbalized understanding and denied any questions.  ______ 

## 2015-01-21 ENCOUNTER — Ambulatory Visit (INDEPENDENT_AMBULATORY_CARE_PROVIDER_SITE_OTHER): Payer: Medicare Other | Admitting: Internal Medicine

## 2015-01-21 ENCOUNTER — Encounter: Payer: Self-pay | Admitting: Internal Medicine

## 2015-01-21 VITALS — BP 124/72 | HR 84 | Ht 68.0 in | Wt 155.6 lb

## 2015-01-21 DIAGNOSIS — Z720 Tobacco use: Secondary | ICD-10-CM

## 2015-01-21 DIAGNOSIS — J449 Chronic obstructive pulmonary disease, unspecified: Secondary | ICD-10-CM

## 2015-01-21 DIAGNOSIS — F17210 Nicotine dependence, cigarettes, uncomplicated: Secondary | ICD-10-CM

## 2015-01-21 NOTE — Patient Instructions (Signed)
Symbicort and some form  Of spiriva daily   Only use your albuterol as a rescue medication to be used if you can't catch your breath by resting or doing a relaxed purse lip breathing pattern.  - The less you use it, the better it will work when you need it. - Ok to use up to 2 puffs  every 4 hours if you must but call for immediate appointment if use goes up over your usual need - Don't leave home without it !!  (think of it like the spare tire for your car)   Consider e cigarettes as a potential one way bridge off of all tobacco products  Please schedule a follow up visit in 6  months but call sooner if needed

## 2015-01-21 NOTE — Progress Notes (Signed)
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  rec trial off acei    12/26/2011 Barnet Pall [name]. --pt reports symptoms remains since last visit 12/19/11 and are worsening-- ON last visit ACE was stopped & benicar substituted. -has had a couple of episodes of awakening at night w extreme SOB , using proair 4x per day and unsure whether to keep using if he has already done so, would like to know if pft can be done today  Spirometry showed moderate airway obstruction -fev1 48%, fvc 74%, ratio 51 rec You have moderate COPD -lung function at 48% Stay on spiriva Add symbicort 160- 2 puffs twice daily Use rescue inhaler every 6h  as needed  01/31/2012 f/u ov/Kerryn Tennant cc breathing better to his satisfaction and not needing rescue inhaler more than twice daily at most "out of habit, not need".  rec Plan A is spiriva and symbicort 2 every 12 hours but as you improve you may be able to taper off the symbicort  Plan B proaire - only use if needed for short breath/ cough / wheeze The key is to stop smoking completely before smoking completely stops you - it's not too late  Benicar 20 mg one half daily   05/16/2012 f/u ov/Bradin Mcadory re copd still smoking  Chief Complaint  Patient presents with  . Follow-up    Pt states breathing is doing well, no new co's  today.   no limiting sob though not very active, not needing saba at all rec Work on inhaler technique:   Spiriva daily and if any symptoms ok add symbcort 1-2 every 12 hours       12/10/2014   ov/Kimiye Strathman re:  aecopd in active smoker with GOLD II criteria at baseline maint on spiriva and symbicort  Chief Complaint  Patient presents with  . Acute Visit    Pt c/o increased cough for the past few months. Cough is prod with greyish white sputum. He has noticed he coughs more when he lies down. His breathing  has been progressively worse and he has been using albuterol HFA 3 x daily on average.    baseline not using saba at all then gradually worse x sev months with doe x adls Cough worse first thing in the morning and takes up to an hour to clear it rec zpak and Prednisone 10 mg take  4 each am x 2 days,   2 each am x 2 days,  1 each am x 2 days and stop  Change spiriva to respimat - two puff each am Work on inhaler technique   01/21/2015  f/u ov/Tennille Montelongo re: GOLD II/ still smoking maint rx with symb/spiriva  Chief Complaint  Patient presents with  . Follow-up    Pt states his  breathing has improved some. He is coughing less. Cough is prod with "dingy" green sputum. He is using albuterol inhaler 2 x daily on average.   really liked the spiriva respimat but [facility] not [facility] Needing saba up to twice daily / no noct use / worse if out in cold or windy conditions  No obvious daytime variabilty or assoc chronic  cp or chest tightness, subjective wheeze overt sinus or hb symptoms. No unusual exp hx or h/o childhood pna/ asthma or premature birth to his knowledge.   Sleeping ok without nocturnal  or early am exacerbation  of respiratory  c/o's or need for noct saba. Also denies any obvious fluctuation of symptoms with weather or environmental changes or other aggravating or alleviating factors except as outlined above   ROS  The following are not active complaints unless bolded sore  throat, dysphagia, dental problems, itching, sneezing,  nasal congestion or excess/ purulent secretions, ear ache,   fever, chills, sweats, unintended wt loss, pleuritic or exertional cp, hemoptysis,  orthopnea pnd or leg swelling, presyncope, palpitations, heartburn, abdominal pain, anorexia, nausea, vomiting, diarrhea  or change in bowel or urinary habits, change in stools or urine, dysuria,hematuria,  rash, arthralgias, visual complaints, headache, numbness weakness or ataxia or problems with walking or coordination,  change in mood/affect or memory.         Objective:   Physical Exam   05/16/2012   162 > 12/10/2014 156 > 01/21/2015   156     01/31/12 163 lb (73.936 kg)  12/26/11 157 lb 3.2 oz (71.305 kg)  12/19/11 159 lb 12.8 oz (72.485 kg)    Gen. Pleasant, thin man, in no distress, normal affect ENT - no lesions, no post nasal drip Neck: No JVD, no thyromegaly, no carotid bruits Lungs: no use of accessory muscles, bilaterally hyperresonant to percussion, decreased bs with distant  insp / exp rhonchi bilaterally  Cardiovascular: Rhythm regular, heart sounds  normal, no murmurs or gallops, no peripheral edema Abdomen: soft and non-tender, no hepatosplenomegaly, BS normal. Musculoskeletal: No deformities, no cyanosis or clubbing Neuro:  alert, non focal      CXR PA and Lateral:   12/10/2014 :    I personally reviewed images and agree with radiology impression as follows:    No active cardiopulmonary disease. Emphysema.    Assessment & Plan:

## 2015-01-26 NOTE — Assessment & Plan Note (Signed)
-   PFT's 01/31/2012 FEV1  2.15 (73%)  With ratio 56 and no better with  and dlco 84% - 12/10/2014  extensive coaching HFA effectiveness =    90% > try respimat spiriva > improved but [facility] would not [facility] for it   Advised try to purchase spiriva respimat thru his insurance if not [facility]  I had an extended discussion with the patient reviewing all relevant studies completed to date and  lasting 15 to 20 minutes of a 25 minute visit   Formulary restrictions will be an ongoing challenge for the forseable future and I would be happy to pick an alternative if the pt will first  provide me a list of them but pt  will need to return here for training for any new device that is required eg dpi vs hfa vs respimat.    In meantime we can always provide samples so the patient never runs out of any needed respiratory medications.    Each maintenance medication was reviewed in detail including most importantly the difference between maintenance and prns and under what circumstances the prns are to be triggered using an action plan format that is not reflected in the computer generated alphabetically organized AVS.    Please see instructions for details which were reviewed in writing and the patient given a copy highlighting the part that I personally wrote and discussed at today's ov.

## 2015-01-26 NOTE — Assessment & Plan Note (Signed)
>   3 m  I took an extended  opportunity with this patient to outline the consequences of continued cigarette use  in airway disorders based on all the data we have from the multiple national lung health studies (perfomed over decades at millions of dollars in cost)  indicating that smoking cessation, not choice of inhalers or physicians, is the most important aspect of care.   rec trial of e cigs as one way bridge off all tobacco products

## 2015-03-08 DIAGNOSIS — I10 Essential (primary) hypertension: Secondary | ICD-10-CM | POA: Diagnosis not present

## 2015-03-08 DIAGNOSIS — Z1389 Encounter for screening for other disorder: Secondary | ICD-10-CM | POA: Diagnosis not present

## 2015-03-08 DIAGNOSIS — E782 Mixed hyperlipidemia: Secondary | ICD-10-CM | POA: Diagnosis not present

## 2015-03-08 DIAGNOSIS — Z6823 Body mass index (BMI) 23.0-23.9, adult: Secondary | ICD-10-CM | POA: Diagnosis not present

## 2015-05-09 DIAGNOSIS — Z0001 Encounter for general adult medical examination with abnormal findings: Secondary | ICD-10-CM | POA: Diagnosis not present

## 2015-05-09 DIAGNOSIS — J449 Chronic obstructive pulmonary disease, unspecified: Secondary | ICD-10-CM | POA: Diagnosis not present

## 2015-05-09 DIAGNOSIS — R7989 Other specified abnormal findings of blood chemistry: Secondary | ICD-10-CM | POA: Diagnosis not present

## 2015-05-09 DIAGNOSIS — R5383 Other fatigue: Secondary | ICD-10-CM | POA: Diagnosis not present

## 2015-05-09 DIAGNOSIS — Z6823 Body mass index (BMI) 23.0-23.9, adult: Secondary | ICD-10-CM | POA: Diagnosis not present

## 2015-05-09 DIAGNOSIS — E782 Mixed hyperlipidemia: Secondary | ICD-10-CM | POA: Diagnosis not present

## 2015-05-16 DIAGNOSIS — Z0001 Encounter for general adult medical examination with abnormal findings: Secondary | ICD-10-CM | POA: Diagnosis not present

## 2015-05-16 DIAGNOSIS — E782 Mixed hyperlipidemia: Secondary | ICD-10-CM | POA: Diagnosis not present

## 2015-05-16 DIAGNOSIS — J449 Chronic obstructive pulmonary disease, unspecified: Secondary | ICD-10-CM | POA: Diagnosis not present

## 2015-05-16 DIAGNOSIS — R5383 Other fatigue: Secondary | ICD-10-CM | POA: Diagnosis not present

## 2015-05-16 DIAGNOSIS — Z1389 Encounter for screening for other disorder: Secondary | ICD-10-CM | POA: Diagnosis not present

## 2015-07-22 ENCOUNTER — Ambulatory Visit: Payer: Medicare Other | Admitting: Internal Medicine

## 2015-08-02 ENCOUNTER — Other Ambulatory Visit (HOSPITAL_COMMUNITY): Payer: Self-pay | Admitting: Family Medicine

## 2015-08-02 ENCOUNTER — Ambulatory Visit (HOSPITAL_COMMUNITY)
Admission: RE | Admit: 2015-08-02 | Discharge: 2015-08-02 | Disposition: A | Discharge status: Home / Self Care | Payer: Medicare Other | Source: Ambulatory Visit | Attending: Family Medicine | Admitting: Family Medicine

## 2015-08-02 DIAGNOSIS — M542 Cervicalgia: Secondary | ICD-10-CM | POA: Insufficient documentation

## 2015-08-02 DIAGNOSIS — I700 Atherosclerosis of aorta: Secondary | ICD-10-CM | POA: Insufficient documentation

## 2015-08-02 DIAGNOSIS — R0602 Shortness of breath: Secondary | ICD-10-CM | POA: Diagnosis not present

## 2015-08-02 DIAGNOSIS — Z6822 Body mass index (BMI) 22.0-22.9, adult: Secondary | ICD-10-CM | POA: Diagnosis not present

## 2015-08-02 DIAGNOSIS — Z1389 Encounter for screening for other disorder: Secondary | ICD-10-CM | POA: Diagnosis not present

## 2015-08-02 DIAGNOSIS — J42 Unspecified chronic bronchitis: Secondary | ICD-10-CM

## 2015-08-02 DIAGNOSIS — J209 Acute bronchitis, unspecified: Secondary | ICD-10-CM | POA: Diagnosis not present

## 2015-08-02 DIAGNOSIS — M5412 Radiculopathy, cervical region: Secondary | ICD-10-CM | POA: Diagnosis not present

## 2015-08-02 DIAGNOSIS — J449 Chronic obstructive pulmonary disease, unspecified: Secondary | ICD-10-CM | POA: Diagnosis not present

## 2015-08-02 DIAGNOSIS — R05 Cough: Secondary | ICD-10-CM | POA: Diagnosis not present

## 2015-08-30 DIAGNOSIS — R938 Abnormal findings on diagnostic imaging of other specified body structures: Secondary | ICD-10-CM | POA: Diagnosis not present

## 2015-08-30 DIAGNOSIS — J181 Lobar pneumonia, unspecified organism: Secondary | ICD-10-CM | POA: Diagnosis not present

## 2015-08-30 DIAGNOSIS — Z719 Counseling, unspecified: Secondary | ICD-10-CM | POA: Diagnosis not present

## 2015-08-30 DIAGNOSIS — Z1389 Encounter for screening for other disorder: Secondary | ICD-10-CM | POA: Diagnosis not present

## 2015-08-30 DIAGNOSIS — Z6822 Body mass index (BMI) 22.0-22.9, adult: Secondary | ICD-10-CM | POA: Diagnosis not present

## 2015-09-12 DIAGNOSIS — J439 Emphysema, unspecified: Secondary | ICD-10-CM | POA: Diagnosis not present

## 2015-09-12 DIAGNOSIS — R918 Other nonspecific abnormal finding of lung field: Secondary | ICD-10-CM | POA: Diagnosis not present

## 2015-09-14 ENCOUNTER — Telehealth: Payer: Self-pay | Admitting: Internal Medicine

## 2015-09-14 NOTE — Telephone Encounter (Signed)
Pts wife Bethena Roys) called back, she asked me if we got a referral for the pt from [place]. I advised her that I havent seen anythng for him. She said she was going to call Judeen Hammans at [employer] office and would call back. I spoke with SS and informed her that pt hasnt been seen since 2002 and he would need an office visit. Pts wife called back and they made an appt for the pt.

## 2015-09-14 NOTE — Telephone Encounter (Signed)
[phone]   Please call patient, wants to discuss results from a test he had done.  Wife very upset

## 2015-09-14 NOTE — Telephone Encounter (Signed)
Tried to call pt- NA- no voicemail. We have not seen pt since 2002.

## 2015-09-14 NOTE — Telephone Encounter (Signed)
Let's get more information.

## 2015-09-16 ENCOUNTER — Encounter: Payer: Self-pay | Admitting: Vascular Surgery

## 2015-09-21 ENCOUNTER — Ambulatory Visit (HOSPITAL_COMMUNITY)
Admission: RE | Admit: 2015-09-21 | Discharge: 2015-09-21 | Disposition: A | Discharge status: Home / Self Care | Payer: Medicare Other | Source: Ambulatory Visit | Attending: Vascular Surgery | Admitting: Vascular Surgery

## 2015-09-21 ENCOUNTER — Ambulatory Visit (INDEPENDENT_AMBULATORY_CARE_PROVIDER_SITE_OTHER): Payer: Medicare Other | Admitting: Vascular Surgery

## 2015-09-21 ENCOUNTER — Encounter: Payer: Self-pay | Admitting: Vascular Surgery

## 2015-09-21 VITALS — BP 129/72 | HR 78 | Ht 68.0 in | Wt 146.6 lb

## 2015-09-21 DIAGNOSIS — I723 Aneurysm of iliac artery: Secondary | ICD-10-CM | POA: Diagnosis not present

## 2015-09-21 NOTE — Progress Notes (Signed)
Patient name: Jared Guerrero MRN: [id number] DOB: [birth year] Sex: male  REASON FOR VISIT: Follow up of right common iliac artery aneurysm.  HPI: Jared Guerrero is a 70 y.o. 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 He did not have an abdominal aortic aneurysm. At the time of his last visit in August 2016, the maximum diameter of his infrarenal aorta was 2.28 cm. The maximum diameter of his right common iliac artery was 3.5 cm. The left common iliac artery measured 1.2 cm in maximum diameter. His right common iliac artery aneurysm was stable in size and I recommended a follow up visit in 1 year. We discussed the importance of tobacco cessation and blood pressure control.  Since I saw him last,He denies any history of abdominal pain or back pain. He does continue to smoke 1 pack per day.  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 I did look at this study but the scan only went down to the renal arteries.  Past Medical History:  Diagnosis Date  . AAA (abdominal aortic aneurysm) ([place])   . Adenomatous polyp   . Bronchitis   . CHF (congestive heart failure) ([place])   . COPD (chronic obstructive pulmonary disease) ([place])   . Diabetes mellitus   . Hyperlipidemia   . Hypertension   . Productive cough     Family History  Problem Relation Age of Onset  . Heart disease Mother 33  . Hyperlipidemia Mother   . Heart attack Mother   . Hypertension Mother   . Heart attack Father 22  . Heart disease Father     before age 58  . Hyperlipidemia Father   . Emphysema Father     was a smoker  . Hypertension Father   . Aneurysm Maternal Grandfather     SOCIAL HISTORY: Social History  Substance Use Topics  . Smoking status: Current Every Day Smoker    Packs/day: 0.50    Years: 35.00    Types: Cigarettes  . Smokeless tobacco: Never Used  . Alcohol use No    No Known  Allergies  Current Outpatient Prescriptions  Medication Sig Dispense Refill  . albuterol (PROAIR HFA) 108 (90 BASE) MCG/ACT inhaler Inhale 2 puffs into the lungs every 6 (six) hours as needed. (Patient taking differently: Inhale 2 puffs into the lungs every 6 (six) hours as needed for wheezing or shortness of breath. ) 1 Inhaler 1  . amLODipine (NORVASC) 10 MG tablet Take 10 mg by mouth daily.    Marland Kitchen aspirin EC 81 MG tablet Take 81 mg by mouth 2 (two) times daily.     . budesonide-formoterol (SYMBICORT) 160-4.5 MCG/ACT inhaler Inhale 2 puffs into the lungs 2 (two) times daily.    . colesevelam (WELCHOL) 625 MG tablet Take 1,875 mg by mouth daily.     Marland Kitchen LORazepam (ATIVAN) 1 MG tablet Take 1 mg by mouth daily as needed for anxiety.     Marland Kitchen losartan (COZAAR) 50 MG tablet Take 50 mg by mouth daily.    . rosuvastatin (CRESTOR) 10 MG tablet Take 5 mg by mouth daily.    Marland Kitchen tiotropium (SPIRIVA) 18 MCG inhalation capsule Place 18 mcg into inhaler and inhale daily.    . chlorpheniramine (CHLOR-TRIMETON) 4 MG tablet Take 4 mg by mouth 2 (two) times daily as  needed for allergies.    Marland Kitchen dextromethorphan-guaiFENesin (MUCINEX DM) 30-600 MG 12hr tablet Take 1 tablet by mouth 2 (two) times daily as needed for cough.    . Tiotropium Bromide Monohydrate (SPIRIVA RESPIMAT) 2.5 MCG/ACT AERS 2 puffs each am (Patient not taking: Reported on 01/21/2015) 1 Inhaler 11   No current facility-administered medications for this visit.     REVIEW OF SYSTEMS:  [X]  denotes positive finding, [ ]  denotes negative finding Cardiac  Comments:  Chest pain or chest pressure:    Shortness of breath upon exertion:    Short of breath when lying flat:    Irregular heart rhythm:        Vascular    Pain in calf, thigh, or hip brought on by ambulation:    Pain in feet at night that wakes you up from your sleep:     Blood clot in your veins:    Leg swelling:         Pulmonary    Oxygen at home:    Productive cough:  X   Wheezing:  X        Neurologic    Sudden weakness in arms or legs:     Sudden numbness in arms or legs:     Sudden onset of difficulty speaking or slurred speech:    Temporary loss of vision in one eye:     Problems with dizziness:         Gastrointestinal    Blood in stool:     Vomited blood:         Genitourinary    Burning when urinating:     Blood in urine:        Psychiatric    Major depression:         Hematologic    Bleeding problems:    Problems with blood clotting too easily:        Skin    Rashes or ulcers:        Constitutional    Fever or chills:      PHYSICAL EXAM: Vitals:   09/21/15 0949  BP: 129/72  Pulse: 78  SpO2: 95%  Weight: 146 lb 9.6 oz (66.5 kg)  Height: 5\' 8"  (1.727 m)    GENERAL: The patient is a well-nourished male, in no acute distress. The vital signs are documented above. CARDIAC: There is a regular rate and rhythm.  VASCULAR: I do not detect carotid bruits. I cannot palpate his right common iliac artery aneurysm. He has palpable femoral and pedal pulses bilaterally. He has no significant lower extremity swelling. PULMONARY: There is good air exchange bilaterally without wheezing or rales. ABDOMEN: Soft and non-tender with normal pitched bowel sounds.  MUSCULOSKELETAL: There are no major deformities or cyanosis. NEUROLOGIC: No focal weakness or paresthesias are detected. SKIN: There are no ulcers or rashes noted. PSYCHIATRIC: The patient has a normal affect.  DATA:   DUPLEX OF HIS ABDOMINAL AORTA: I have been interpreted his duplex of his abdominal aorta. The maximum diameter of his infrarenal aorta is 2.98 cm. The maximum diameter of his right common iliac artery is 4 cm. The maximum diameter of his left common iliac artery is 1.42 cm.  MEDICAL ISSUES:  4.0 CM RIGHT COMMON ILIAC ARTERY ANEURYSM: This aneurysm measured 3 cm in 2008. It is been relatively stable at 3.5 cm over the last several years. The aneurysm has now increased  slightly to  4.0 cm. We have again discussed the importance of tobacco  cessation. He understands and continued tobacco use does increase his risk of continued aneurysm enlargement. His blood pressure appears to be under good control. I ordered a CT angiogram of the abdomen and pelvis with 1 mm cuts when he comes back in 6 months. If the right common iliac artery aneurysm continues to enlarge and I think we should consider elective repair. Of note, he does have significant COPD.    [name] [facility] [phone]

## 2015-09-23 NOTE — Addendum Note (Signed)
Addended by: Thresa Ross C on: 09/23/2015 03:03 PM   Modules accepted: Orders

## 2015-09-28 DIAGNOSIS — E279 Disorder of adrenal gland, unspecified: Secondary | ICD-10-CM | POA: Diagnosis not present

## 2015-10-06 ENCOUNTER — Telehealth: Payer: Self-pay

## 2015-10-06 NOTE — Telephone Encounter (Signed)
Opened in error

## 2015-10-10 ENCOUNTER — Ambulatory Visit (INDEPENDENT_AMBULATORY_CARE_PROVIDER_SITE_OTHER): Payer: Medicare Other | Admitting: Nurse Practitioner

## 2015-10-10 ENCOUNTER — Other Ambulatory Visit: Payer: Self-pay

## 2015-10-10 ENCOUNTER — Encounter: Payer: Self-pay | Admitting: Nurse Practitioner

## 2015-10-10 DIAGNOSIS — R938 Abnormal findings on diagnostic imaging of other specified body structures: Secondary | ICD-10-CM | POA: Diagnosis not present

## 2015-10-10 DIAGNOSIS — Z860100 Personal history of colon polyps, unspecified: Secondary | ICD-10-CM

## 2015-10-10 DIAGNOSIS — R9389 Abnormal findings on diagnostic imaging of other specified body structures: Secondary | ICD-10-CM

## 2015-10-10 MED ORDER — PEG 3350-KCL-NA BICARB-NACL 420 G PO SOLR: 4000.0000 mL | ORAL | 0 refills | Status: DC

## 2015-10-10 NOTE — Progress Notes (Signed)
CC'ED TO PCP 

## 2015-10-10 NOTE — Assessment & Plan Note (Signed)
Patient with an abnormal CT of the chest, available in care everywhere under [facility]. Findings pertinent to GI include 7 mm thickening of the proximal gastrium recommended endoscopic evaluation. The patient is generally asymptomatic from a GI standpoint. We will proceed with endoscopy as recommended.  Proceed with EGD with Dr. Gala Romney in near future: the risks, benefits, and alternatives have been discussed with the patient in detail. The patient states understanding and desires to proceed.  The patient is on Ativan in the evenings, only as needed, for sleep. No other anticoagulants, anxiolytics, chronic pain medications other than Neurontin, or antidepressants. Conscious sedation should be adequate for his procedure.

## 2015-10-10 NOTE — Progress Notes (Signed)
Primary Care Physician:  Purvis Kilts, MD Primary Gastroenterologist:  Dr. Gala Romney  Chief Complaint  Patient presents with  . gastric thickening    found on CT, no sx    HPI:   Jared Guerrero is a 70 y.o. male who presents on referral from primary care for possible gastric wall thickening. PCP notes reviewed. CT report from outside facility reviewed. Chest CT was completed at [facility] on 09/12/2015 which found moderate to severe emphysema, right lung pulmonary nodule the largest measuring 1.3 cm which could be infectious versus inflammatory but follow-up is recommended, indeterminate 1.3 left adrenal nodule with recommended further workup with a dedicated adrenal protocol CT or MRI. Related to GI a small 7 mm enhancing lesion in the proximal gastric body was found and recommended consider further evaluation with endoscopy.  Today he states he's feeling good overall. Denies abdominal pain, N/V, hematochezia, melena. Denies problems with his appetite, eats good but is "unable to put weight on." Has gone from 180 to 146 lb (today) over the past 10 years, feels he's lost a lot of muscle mass. Has regular bowel movements consistent with [place] 4. Denies GERD symptoms. Last colonoscopy 2002 here. Denies chest pain, dyspnea, dizziness, lightheadedness, syncope, near syncope. Denies any other upper or lower GI symptoms.  Last colonoscopy 2002, has not followed through with recommended repeat exam. At best case he is 5 years overdue. 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  Past Medical History:  Diagnosis Date  . AAA (abdominal aortic aneurysm) ([place])   . Adenomatous polyp   . Bronchitis   . CHF (congestive heart failure) ([place])   . COPD (chronic obstructive pulmonary disease) ([place])   . Diabetes mellitus   . Hyperlipidemia   . Hypertension   . Productive cough     Past Surgical History:  Procedure Laterality Date  . COLONOSCOPY   06/17/00   Dr. Vivi Ferns rectum, diminutive polyp in the sigmoid, flat polyp in the cecum, partially removed- adenomatous polyp  . COLONOSCOPY  09/16/00   Dr. Gala Romney- normal appearing rectum, clot overlying a polypectomy site with oozing in the cecum , this lesion was treated, small cecal polyps= tubular adenoma  . FOOT SURGERY  2005   right foot   . SPINE SURGERY  1987   L4-5 diskectomy    Current Outpatient Prescriptions  Medication Sig Dispense Refill  . albuterol (PROAIR HFA) 108 (90 BASE) MCG/ACT inhaler Inhale 2 puffs into the lungs every 6 (six) hours as needed. (Patient taking differently: Inhale 2 puffs into the lungs every 6 (six) hours as needed for wheezing or shortness of breath. ) 1 Inhaler 1  . amLODipine (NORVASC) 10 MG tablet Take 10 mg by mouth daily.    Marland Kitchen aspirin EC 81 MG tablet Take 81 mg by mouth 2 (two) times daily.     . budesonide-formoterol (SYMBICORT) 160-4.5 MCG/ACT inhaler Inhale 2 puffs into the lungs 2 (two) times daily.    . colesevelam (WELCHOL) 625 MG tablet Take 1,875 mg by mouth daily.     Marland Kitchen gabapentin (NEURONTIN) 300 MG capsule Take 1 capsule by mouth at bedtime.    Marland Kitchen LORazepam (ATIVAN) 1 MG tablet Take 1 mg by mouth daily as needed for anxiety.     Marland Kitchen losartan (COZAAR) 50 MG tablet Take 50 mg by mouth daily.    . rosuvastatin (CRESTOR) 10 MG tablet Take 5 mg by mouth daily.    Marland Kitchen  tiotropium (SPIRIVA) 18 MCG inhalation capsule Place 18 mcg into inhaler and inhale daily.    . chlorpheniramine (CHLOR-TRIMETON) 4 MG tablet Take 4 mg by mouth 2 (two) times daily as needed for allergies.    Marland Kitchen dextromethorphan-guaiFENesin (MUCINEX DM) 30-600 MG 12hr tablet Take 1 tablet by mouth 2 (two) times daily as needed for cough.    . Tiotropium Bromide Monohydrate (SPIRIVA RESPIMAT) 2.5 MCG/ACT AERS 2 puffs each am (Patient not taking: Reported on 10/10/2015) 1 Inhaler 11   No current facility-administered medications for this visit.     Allergies as of 10/10/2015  .  (No Known Allergies)    Family History  Problem Relation Age of Onset  . Heart disease Mother 74  . Hyperlipidemia Mother   . Heart attack Mother   . Hypertension Mother   . Heart attack Father 63  . Heart disease Father     before age 58  . Hyperlipidemia Father   . Emphysema Father     was a smoker  . Hypertension Father   . Aneurysm Maternal Grandfather     Social History   Social History  . Marital status: Married    Spouse name: N/A  . Number of children: N/A  . Years of education: N/A   Occupational History  . Retired Retired    Marketing executive work   Social History Main Topics  . Smoking status: Current Every Day Smoker    Packs/day: 0.50    Years: 35.00    Types: Cigarettes  . Smokeless tobacco: Never Used  . Alcohol use No  . Drug use: No  . Sexual activity: Not on file   Other Topics Concern  . Not on file   Social History Narrative  . No narrative on file    Review of Systems: General: Negative for anorexia, weight loss, fever, chills, fatigue, weakness. ENT: Negative for hoarseness, difficulty swallowing. CV: Negative for chest pain, angina, palpitations, peripheral edema.  Respiratory: Negative for dyspnea at rest, sputum, wheezing. Admits intermittent cough. GI: See history of present illness. MS: Negative for joint pain, low back pain.  Derm: Negative for rash or itching.  Endo: Negative for unusual weight change.  Heme: Negative for bruising or bleeding. Allergy: Negative for rash or hives.    Physical Exam: BP 122/72   Pulse 88   Temp 97.8 F (36.6 C) (Oral)   Ht 5\' 8"  (1.727 m)   Wt 146 lb 9.6 oz (66.5 kg)   BMI 22.29 kg/m  General:   Alert and oriented. Pleasant and cooperative. Well-nourished and well-developed.  Head:  Normocephalic and atraumatic. Eyes:  Without icterus, sclera clear and conjunctiva pink.  Ears:  Normal auditory acuity. Cardiovascular:  S1, S2 present without murmurs appreciated. Extremities without clubbing or  edema. Respiratory:  Noted bilateral wheezes. No rales, or rhonchi. No distress.  Gastrointestinal:  +BS, soft, non-tender and non-distended. No HSM noted. No guarding or rebound. No masses appreciated.  Rectal:  Deferred  Musculoskalatal:  Symmetrical without gross deformities. Neurologic:  Alert and oriented x4;  grossly normal neurologically. Psych:  Alert and cooperative. Normal mood and affect. Heme/Lymph/Immune: No excessive bruising noted.    10/10/2015 8:14 AM   Disclaimer: This note was dictated with voice recognition software. Similar sounding words can inadvertently be transcribed and may not be corrected upon review.

## 2015-10-10 NOTE — Assessment & Plan Note (Addendum)
The patient has a history of colon polyps, last colonoscopy 2002 with sessile polyps found to be a mix of hyperplastic and tubulovillous adenoma. He has not had a repeat colonoscopy since then. He is significantly overdue, 5 years overdue at best case although more likely 10 years. He agrees to have a colonoscopy in addition to his needed endoscopy as above. We will proceed with surveillance colonoscopy at this time.  Proceed with TCS with Dr. Gala Romney in near future: the risks, benefits, and alternatives have been discussed with the patient in detail. The patient states understanding and desires to proceed.  The patient is on Ativan in the evenings, only as needed, for sleep. No other anticoagulants, anxiolytics, chronic pain medications, or antidepressants. Conscious sedation should be adequate for his procedure.

## 2015-10-10 NOTE — Patient Instructions (Signed)
1. We'll schedule your procedure for you. 2. Return for follow-up based on recommendations made after your procedure.

## 2015-10-20 ENCOUNTER — Encounter (HOSPITAL_COMMUNITY): Payer: Self-pay | Admitting: *Deleted

## 2015-10-20 ENCOUNTER — Ambulatory Visit (HOSPITAL_COMMUNITY)
Admission: RE | Admit: 2015-10-20 | Discharge: 2015-10-20 | Disposition: A | Discharge status: Home / Self Care | Payer: Medicare Other | Source: Ambulatory Visit | Attending: Internal Medicine | Admitting: Internal Medicine

## 2015-10-20 ENCOUNTER — Encounter (HOSPITAL_COMMUNITY)
Admission: RE | Disposition: A | Discharge status: Home / Self Care | Payer: Self-pay | Source: Ambulatory Visit | Attending: Internal Medicine

## 2015-10-20 DIAGNOSIS — I509 Heart failure, unspecified: Secondary | ICD-10-CM | POA: Diagnosis not present

## 2015-10-20 DIAGNOSIS — K3189 Other diseases of stomach and duodenum: Secondary | ICD-10-CM | POA: Diagnosis not present

## 2015-10-20 DIAGNOSIS — Z1211 Encounter for screening for malignant neoplasm of colon: Secondary | ICD-10-CM | POA: Diagnosis not present

## 2015-10-20 DIAGNOSIS — K210 Gastro-esophageal reflux disease with esophagitis: Secondary | ICD-10-CM | POA: Insufficient documentation

## 2015-10-20 DIAGNOSIS — Z86010 Personal history of colonic polyps: Secondary | ICD-10-CM | POA: Diagnosis not present

## 2015-10-20 DIAGNOSIS — I110 Hypertensive heart disease with heart failure: Secondary | ICD-10-CM | POA: Insufficient documentation

## 2015-10-20 DIAGNOSIS — Z79899 Other long term (current) drug therapy: Secondary | ICD-10-CM | POA: Diagnosis not present

## 2015-10-20 DIAGNOSIS — K635 Polyp of colon: Secondary | ICD-10-CM | POA: Insufficient documentation

## 2015-10-20 DIAGNOSIS — K589 Irritable bowel syndrome without diarrhea: Secondary | ICD-10-CM | POA: Insufficient documentation

## 2015-10-20 DIAGNOSIS — D124 Benign neoplasm of descending colon: Secondary | ICD-10-CM | POA: Diagnosis not present

## 2015-10-20 DIAGNOSIS — D120 Benign neoplasm of cecum: Secondary | ICD-10-CM

## 2015-10-20 DIAGNOSIS — K209 Esophagitis, unspecified: Secondary | ICD-10-CM | POA: Diagnosis not present

## 2015-10-20 DIAGNOSIS — E785 Hyperlipidemia, unspecified: Secondary | ICD-10-CM | POA: Diagnosis not present

## 2015-10-20 DIAGNOSIS — R9389 Abnormal findings on diagnostic imaging of other specified body structures: Secondary | ICD-10-CM

## 2015-10-20 DIAGNOSIS — J449 Chronic obstructive pulmonary disease, unspecified: Secondary | ICD-10-CM | POA: Insufficient documentation

## 2015-10-20 DIAGNOSIS — K2950 Unspecified chronic gastritis without bleeding: Secondary | ICD-10-CM | POA: Insufficient documentation

## 2015-10-20 DIAGNOSIS — Z7982 Long term (current) use of aspirin: Secondary | ICD-10-CM | POA: Diagnosis not present

## 2015-10-20 DIAGNOSIS — R933 Abnormal findings on diagnostic imaging of other parts of digestive tract: Secondary | ICD-10-CM | POA: Diagnosis not present

## 2015-10-20 DIAGNOSIS — E119 Type 2 diabetes mellitus without complications: Secondary | ICD-10-CM | POA: Diagnosis not present

## 2015-10-20 HISTORY — PX: POLYPECTOMY: SHX5525

## 2015-10-20 HISTORY — PX: COLONOSCOPY: SHX5424

## 2015-10-20 HISTORY — PX: ESOPHAGOGASTRODUODENOSCOPY: SHX5428

## 2015-10-20 HISTORY — PX: BIOPSY: SHX5522

## 2015-10-20 LAB — GLUCOSE, CAPILLARY: Glucose-Capillary: 107 mg/dL — ABNORMAL HIGH (ref 65–99)

## 2015-10-20 SURGERY — COLONOSCOPY
Anesthesia: Moderate Sedation

## 2015-10-20 MED ORDER — SODIUM CHLORIDE 0.9 % IV SOLN
INTRAVENOUS | Status: DC
  Administered 2015-10-20: 11:00:00 via INTRAVENOUS

## 2015-10-20 MED ORDER — LIDOCAINE VISCOUS 2 % MT SOLN
OROMUCOSAL | Status: DC | PRN
  Administered 2015-10-20: 3 mL via OROMUCOSAL

## 2015-10-20 MED ORDER — STERILE WATER FOR IRRIGATION IR SOLN
Status: DC | PRN
  Administered 2015-10-20: 11:00:00

## 2015-10-20 MED ORDER — ONDANSETRON HCL 4 MG/2ML IJ SOLN
INTRAMUSCULAR | Status: AC
  Filled 2015-10-20: qty 2

## 2015-10-20 MED ORDER — SODIUM CHLORIDE 0.9% FLUSH
INTRAVENOUS | Status: AC
  Filled 2015-10-20: qty 20

## 2015-10-20 MED ORDER — LIDOCAINE VISCOUS 2 % MT SOLN
OROMUCOSAL | Status: AC
  Filled 2015-10-20: qty 15

## 2015-10-20 MED ORDER — MIDAZOLAM HCL 5 MG/5ML IJ SOLN
INTRAMUSCULAR | Status: DC | PRN
  Administered 2015-10-20 (×2): 1 mg via INTRAVENOUS
  Administered 2015-10-20: 2 mg via INTRAVENOUS
  Administered 2015-10-20 (×4): 1 mg via INTRAVENOUS
  Administered 2015-10-20: 2 mg via INTRAVENOUS

## 2015-10-20 MED ORDER — MEPERIDINE HCL 100 MG/ML IJ SOLN
INTRAMUSCULAR | Status: DC | PRN
  Administered 2015-10-20: 50 mg via INTRAVENOUS
  Administered 2015-10-20 (×2): 25 mg via INTRAVENOUS

## 2015-10-20 MED ORDER — MIDAZOLAM HCL 5 MG/5ML IJ SOLN
INTRAMUSCULAR | Status: AC
  Filled 2015-10-20: qty 10

## 2015-10-20 MED ORDER — ONDANSETRON HCL 4 MG/2ML IJ SOLN
INTRAMUSCULAR | Status: DC | PRN
  Administered 2015-10-20: 4 mg via INTRAVENOUS

## 2015-10-20 MED ORDER — MEPERIDINE HCL 100 MG/ML IJ SOLN
INTRAMUSCULAR | Status: AC
  Filled 2015-10-20: qty 2

## 2015-10-20 NOTE — H&P (View-Only) (Signed)
Primary Care Physician:  Purvis Kilts, MD Primary Gastroenterologist:  Dr. Gala Romney  Chief Complaint  Patient presents with  . gastric thickening    found on CT, no sx    HPI:   Jared Guerrero is a 70 y.o. male who presents on referral from primary care for possible gastric wall thickening. PCP notes reviewed. CT report from outside facility reviewed. Chest CT was completed at [facility] on 09/12/2015 which found moderate to severe emphysema, right lung pulmonary nodule the largest measuring 1.3 cm which could be infectious versus inflammatory but follow-up is recommended, indeterminate 1.3 left adrenal nodule with recommended further workup with a dedicated adrenal protocol CT or MRI. Related to GI a small 7 mm enhancing lesion in the proximal gastric body was found and recommended consider further evaluation with endoscopy.  Today he states he's feeling good overall. Denies abdominal pain, N/V, hematochezia, melena. Denies problems with his appetite, eats good but is "unable to put weight on." Has gone from 180 to 146 lb (today) over the past 10 years, feels he's lost a lot of muscle mass. Has regular bowel movements consistent with [place] 4. Denies GERD symptoms. Last colonoscopy 2002 here. Denies chest pain, dyspnea, dizziness, lightheadedness, syncope, near syncope. Denies any other upper or lower GI symptoms.  Last colonoscopy 2002, has not followed through with recommended repeat exam. At best case he is 5 years overdue. 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  Past Medical History:  Diagnosis Date  . AAA (abdominal aortic aneurysm) ([place])   . Adenomatous polyp   . Bronchitis   . CHF (congestive heart failure) ([place])   . COPD (chronic obstructive pulmonary disease) ([place])   . Diabetes mellitus   . Hyperlipidemia   . Hypertension   . Productive cough     Past Surgical History:  Procedure Laterality Date  . COLONOSCOPY   06/17/00   Dr. Vivi Ferns rectum, diminutive polyp in the sigmoid, flat polyp in the cecum, partially removed- adenomatous polyp  . COLONOSCOPY  09/16/00   Dr. Gala Romney- normal appearing rectum, clot overlying a polypectomy site with oozing in the cecum , this lesion was treated, small cecal polyps= tubular adenoma  . FOOT SURGERY  2005   right foot   . SPINE SURGERY  1987   L4-5 diskectomy    Current Outpatient Prescriptions  Medication Sig Dispense Refill  . albuterol (PROAIR HFA) 108 (90 BASE) MCG/ACT inhaler Inhale 2 puffs into the lungs every 6 (six) hours as needed. (Patient taking differently: Inhale 2 puffs into the lungs every 6 (six) hours as needed for wheezing or shortness of breath. ) 1 Inhaler 1  . amLODipine (NORVASC) 10 MG tablet Take 10 mg by mouth daily.    Marland Kitchen aspirin EC 81 MG tablet Take 81 mg by mouth 2 (two) times daily.     . budesonide-formoterol (SYMBICORT) 160-4.5 MCG/ACT inhaler Inhale 2 puffs into the lungs 2 (two) times daily.    . colesevelam (WELCHOL) 625 MG tablet Take 1,875 mg by mouth daily.     Marland Kitchen gabapentin (NEURONTIN) 300 MG capsule Take 1 capsule by mouth at bedtime.    Marland Kitchen LORazepam (ATIVAN) 1 MG tablet Take 1 mg by mouth daily as needed for anxiety.     Marland Kitchen losartan (COZAAR) 50 MG tablet Take 50 mg by mouth daily.    . rosuvastatin (CRESTOR) 10 MG tablet Take 5 mg by mouth daily.    Marland Kitchen  tiotropium (SPIRIVA) 18 MCG inhalation capsule Place 18 mcg into inhaler and inhale daily.    . chlorpheniramine (CHLOR-TRIMETON) 4 MG tablet Take 4 mg by mouth 2 (two) times daily as needed for allergies.    Marland Kitchen dextromethorphan-guaiFENesin (MUCINEX DM) 30-600 MG 12hr tablet Take 1 tablet by mouth 2 (two) times daily as needed for cough.    . Tiotropium Bromide Monohydrate (SPIRIVA RESPIMAT) 2.5 MCG/ACT AERS 2 puffs each am (Patient not taking: Reported on 10/10/2015) 1 Inhaler 11   No current facility-administered medications for this visit.     Allergies as of 10/10/2015  .  (No Known Allergies)    Family History  Problem Relation Age of Onset  . Heart disease Mother 64  . Hyperlipidemia Mother   . Heart attack Mother   . Hypertension Mother   . Heart attack Father 82  . Heart disease Father     before age 22  . Hyperlipidemia Father   . Emphysema Father     was a smoker  . Hypertension Father   . Aneurysm Maternal Grandfather     Social History   Social History  . Marital status: Married    Spouse name: N/A  . Number of children: N/A  . Years of education: N/A   Occupational History  . Retired Retired    Marketing executive work   Social History Main Topics  . Smoking status: Current Every Day Smoker    Packs/day: 0.50    Years: 35.00    Types: Cigarettes  . Smokeless tobacco: Never Used  . Alcohol use No  . Drug use: No  . Sexual activity: Not on file   Other Topics Concern  . Not on file   Social History Narrative  . No narrative on file    Review of Systems: General: Negative for anorexia, weight loss, fever, chills, fatigue, weakness. ENT: Negative for hoarseness, difficulty swallowing. CV: Negative for chest pain, angina, palpitations, peripheral edema.  Respiratory: Negative for dyspnea at rest, sputum, wheezing. Admits intermittent cough. GI: See history of present illness. MS: Negative for joint pain, low back pain.  Derm: Negative for rash or itching.  Endo: Negative for unusual weight change.  Heme: Negative for bruising or bleeding. Allergy: Negative for rash or hives.    Physical Exam: BP 122/72   Pulse 88   Temp 97.8 F (36.6 C) (Oral)   Ht 5\' 8"  (1.727 m)   Wt 146 lb 9.6 oz (66.5 kg)   BMI 22.29 kg/m  General:   Alert and oriented. Pleasant and cooperative. Well-nourished and well-developed.  Head:  Normocephalic and atraumatic. Eyes:  Without icterus, sclera clear and conjunctiva pink.  Ears:  Normal auditory acuity. Cardiovascular:  S1, S2 present without murmurs appreciated. Extremities without clubbing or  edema. Respiratory:  Noted bilateral wheezes. No rales, or rhonchi. No distress.  Gastrointestinal:  +BS, soft, non-tender and non-distended. No HSM noted. No guarding or rebound. No masses appreciated.  Rectal:  Deferred  Musculoskalatal:  Symmetrical without gross deformities. Neurologic:  Alert and oriented x4;  grossly normal neurologically. Psych:  Alert and cooperative. Normal mood and affect. Heme/Lymph/Immune: No excessive bruising noted.    10/10/2015 8:14 AM   Disclaimer: This note was dictated with voice recognition software. Similar sounding words can inadvertently be transcribed and may not be corrected upon review.

## 2015-10-20 NOTE — Interval H&P Note (Signed)
History and Physical Interval Note:  10/20/2015 11:04 AM  Jared Guerrero  has presented today for surgery, with the diagnosis of Abnormal CT, hx polyps  The various methods of treatment have been discussed with the patient and family. After consideration of risks, benefits and other options for treatment, the patient has consented to  Procedure(s) with comments: COLONOSCOPY (N/A) - 12:00 PM ESOPHAGOGASTRODUODENOSCOPY (EGD) (N/A) as a surgical intervention .  The patient's history has been reviewed, patient examined, no change in status, stable for surgery.  I have reviewed the patient's chart and labs.  Questions were answered to the patient's satisfaction.     No change. No dysphagia.  Diagnostic EGD and surveillance colonoscopy per plan.  The risks, benefits, limitations, imponderables and alternatives regarding both EGD and colonoscopy have been reviewed with the patient. Questions have been answered. All parties agreeable.    Manus Rudd

## 2015-10-20 NOTE — Discharge Instructions (Addendum)
EGD Discharge instructions Please read the instructions outlined below and refer to this sheet in the next few weeks. These discharge instructions provide you with general information on caring for yourself after you leave the hospital. Your doctor may also give you specific instructions. While your treatment has been planned according to the most current medical practices available, unavoidable complications occasionally occur. If you have any problems or questions after discharge, please call your doctor. ACTIVITY  You may resume your regular activity but move at a slower pace for the next 24 hours.   Take frequent rest periods for the next 24 hours.   Walking will help expel (get rid of) the air and reduce the bloated feeling in your abdomen.   No driving for 24 hours (because of the anesthesia (medicine) used during the test).   You may shower.   Do not sign any important legal documents or operate any machinery for 24 hours (because of the anesthesia used during the test).  NUTRITION  Drink plenty of fluids.   You may resume your normal diet.   Begin with a light meal and progress to your normal diet.   Avoid alcoholic beverages for 24 hours or as instructed by your caregiver.  MEDICATIONS  You may resume your normal medications unless your caregiver tells you otherwise.  WHAT YOU CAN EXPECT TODAY  You may experience abdominal discomfort such as a feeling of fullness or gas pains.  FOLLOW-UP  Your doctor will discuss the results of your test with you.  SEEK IMMEDIATE MEDICAL ATTENTION IF ANY OF THE FOLLOWING OCCUR:  Excessive nausea (feeling sick to your stomach) and/or vomiting.   Severe abdominal pain and distention (swelling).   Trouble swallowing.   Temperature over 101 F (37.8 C).   Rectal bleeding or vomiting of blood.     Colonoscopy Discharge Instructions  Read the instructions outlined below and refer to this sheet in the next few weeks. These  discharge instructions provide you with general information on caring for yourself after you leave the hospital. Your doctor may also give you specific instructions. While your treatment has been planned according to the most current medical practices available, unavoidable complications occasionally occur. If you have any problems or questions after discharge, call Dr. Gala Romney at [phone]. ACTIVITY  You may resume your regular activity, but move at a slower pace for the next 24 hours.   Take frequent rest periods for the next 24 hours.   Walking will help get rid of the air and reduce the bloated feeling in your belly (abdomen).   No driving for 24 hours (because of the medicine (anesthesia) used during the test).    Do not sign any important legal documents or operate any machinery for 24 hours (because of the anesthesia used during the test).  NUTRITION  Drink plenty of fluids.   You may resume your normal diet as instructed by your doctor.   Begin with a light meal and progress to your normal diet. Heavy or fried foods are harder to digest and may make you feel sick to your stomach (nauseated).   Avoid alcoholic beverages for 24 hours or as instructed.  MEDICATIONS  You may resume your normal medications unless your doctor tells you otherwise.  WHAT YOU CAN EXPECT TODAY  Some feelings of bloating in the abdomen.   Passage of more gas than usual.   Spotting of blood in your stool or on the toilet paper.  IF YOU HAD POLYPS REMOVED DURING  THE COLONOSCOPY:  No aspirin products for 7 days or as instructed.   No alcohol for 7 days or as instructed.   Eat a soft diet for the next 24 hours.  FINDING OUT THE RESULTS OF YOUR TEST Not all test results are available during your visit. If your test results are not back during the visit, make an appointment with your caregiver to find out the results. Do not assume everything is normal if you have not heard from your caregiver or the  medical facility. It is important for you to follow up on all of your test results.  SEEK IMMEDIATE MEDICAL ATTENTION IF:  You have more than a spotting of blood in your stool.   Your belly is swollen (abdominal distention).   You are nauseated or vomiting.   You have a temperature over 101.   You have abdominal pain or discomfort that is severe or gets worse throughout the day.    GERD information provided  Colon polyp information provided  Stop aspirin for 10 days; no nonsteroidal drugs during this time either  Depending on lab report on polyps, at a minimum, you will need a repeat colonoscopy in 6 months  Begin Protonix 40 mg daily

## 2015-10-20 NOTE — Op Note (Signed)
[facility] Patient Name: Jared Guerrero Procedure Date: 10/20/2015 11:26 AM MRN: [id number] Date of Birth: April 25, 1945 Attending MD: Norvel Richards , MD CSN: [id number] Age: 70 Admit Type: Outpatient Procedure:                Colonoscopy Indications:              High risk colon cancer surveillance: Personal                            history of colonic polyps Providers:                Norvel Richards, MD, Gwenlyn Fudge RN, RN,                            Janeece Riggers, RN Referring MD:              Medicines:                Midazolam 10 mg IV, Meperidine 100 mg IV,                            Ondansetron 4 mg IV Complications:            No immediate complications. Estimated blood loss:                            Minimal. Estimated Blood Loss:     Estimated blood loss was minimal. Procedure:                Pre-Anesthesia Assessment:                           - Prior to the procedure, a History and Physical                            was performed, and patient medications and                            allergies were reviewed. The patient's tolerance of                            previous anesthesia was also reviewed. The risks                            and benefits of the procedure and the sedation                            options and risks were discussed with the patient.                            All questions were answered, and informed consent                            was obtained. Prior Anticoagulants: The patient has  taken no previous anticoagulant or antiplatelet                            agents. ASA Grade Assessment: III - A patient with                            severe systemic disease. After reviewing the risks                            and benefits, the patient was deemed in                            satisfactory condition to undergo the procedure.                           After obtaining informed consent, the colonoscope                             was passed under direct vision. Throughout the                            procedure, the patient's blood pressure, pulse, and                            oxygen saturations were monitored continuously. The                            [id number]) scope was introduced through                            the anus and advanced to the the cecum, identified                            by appendiceal orifice and ileocecal valve. The                            colonoscopy was performed without difficulty. The                            patient tolerated the procedure well. The quality                            of the bowel preparation was adequate. The                            ileocecal valve, appendiceal orifice, and rectum                            were photographed. Scope In: 11:31:33 AM Scope Out: [id number] PM Total Procedure Duration: 1 hour 15 minutes 25 seconds  Findings:      The perianal and digital rectal examinations were normal. spastic colon.       In the cecum, there was a 4.5 cm x 3 cm carpet- type polyp. It was  lifted with sterile normal saline injected submucoasally with       sclerotherapy needle. Subsequently, multiple passes with the hot snare       were used to piecemeal remove this lesion. The periphery of the       polypectomy site was blated with APC at 20 J on the right colon       settings.. There were multiple 4- 8 mm polyps in the vicinity of the       ileocecal valve also submitted with the cecal polyp fragments. There       were 4-8 mm polyps in the descending colon hot or cold snare removed.       There may have been a small polyp in the descending colon which could       not be reached due to the spasm. Impression:               - multiple colonic polyps removed removed in                            piecemeal with hot snare/ablation. It is possible                            that all polyps were not removed Moderate  Sedation:      Moderate (conscious) sedation was administered by the endoscopy nurse       and supervised by the endoscopist. The following parameters were       monitored: oxygen saturation, heart rate, blood pressure, respiratory       rate, EKG, adequacy of pulmonary ventilation, and response to care.       Total physician intraservice time was 100 minutes. Recommendation:           - Patient has a contact number available for                            emergencies. The signs and symptoms of potential                            delayed complications were discussed with the                            patient. Return to normal activities tomorrow.                            Written discharge instructions were provided to the                            patient.                           - Advance diet as tolerated.                           - Continue present medications.                           - No aspirin, ibuprofen, naproxen, or other  non-steroidal anti-inflammatory drugs for 10 days                            after polyp removal.                           - Repeat colonoscopy date to be determined after                            pending pathology results are reviewed for                            surveillance based on pathology results.                           - Return to GI office (date not yet determined). Procedure Code(s):        --- Professional ---                           [phone], Colonoscopy, flexible; diagnostic, including                            collection of specimen(s) by brushing or washing,                            when performed (separate procedure)                           99152, Moderate sedation services provided by the                            same physician or other qualified health care                            professional performing the diagnostic or                            therapeutic service that the sedation  supports,                            requiring the presence of an independent trained                            observer to assist in the monitoring of the                            patient's level of consciousness and physiological                            status; initial 15 minutes of intraservice time,                            patient age 52 years or older  [id number], Moderate sedation services; each additional                            15 minutes intraservice time                           [id number], Moderate sedation services; each additional                            15 minutes intraservice time                           99153, Moderate sedation services; each additional                            15 minutes intraservice time                           99153, Moderate sedation services; each additional                            15 minutes intraservice time                           99153, Moderate sedation services; each additional                            15 minutes intraservice time Diagnosis Code(s):        --- Professional ---                           Z86.010, Personal history of colonic polyps                           D12.0, Benign neoplasm of cecum CPT copyright 2016 [employer]. All rights reserved. The codes documented in this report are preliminary and upon coder review may  be revised to meet current compliance requirements. Cristopher Estimable. Geneveive Furness, MD Norvel Richards, MD 10/20/2015 1:11:23 PM This report has been signed electronically. Number of Addenda: 0

## 2015-10-21 ENCOUNTER — Encounter: Payer: Self-pay | Admitting: Internal Medicine

## 2015-10-21 NOTE — Op Note (Signed)
[facility] Patient Name: Jared Guerrero Procedure Date: 10/20/2015 10:45 AM MRN: [id number] Date of Birth: [birth year] Attending MD: Norvel Richards , MD CSN: [id number] Age: 70 Admit Type: Outpatient Procedure:                Upper GI endoscopy Indications:              Abnormal CT of the GI tract Providers:                Norvel Richards, MD, Otis Peak B. Gwenlyn Perking RN, RN,                            Janeece Riggers, RN Referring MD:              Medicines:                Midazolam 5 mg IV, Meperidine 75 mg IV, Ondansetron                            4 mg IV Complications:            No immediate complications. Estimated blood loss:                            Minimal. Estimated Blood Loss:     Estimated blood loss was minimal. Procedure:                Pre-Anesthesia Assessment:                           - Prior to the procedure, a History and Physical                            was performed, and patient medications and                            allergies were reviewed. The patient's tolerance of                            previous anesthesia was also reviewed. The risks                            and benefits of the procedure and the sedation                            options and risks were discussed with the patient.                            All questions were answered, and informed consent                            was obtained. Prior Anticoagulants: The patient has                            taken no previous anticoagulant or antiplatelet  agents. ASA Grade Assessment: III - A patient with                            severe systemic disease. After reviewing the risks                            and benefits, the patient was deemed in                            satisfactory condition to undergo the procedure.                           After obtaining informed consent, the endoscope was                            passed under direct vision.  Throughout the                            procedure, the patient's blood pressure, pulse, and                            oxygen saturations were monitored continuously. The                            [id number] scope was introduced through the                            mouth, and advanced to the second part of duodenum.                            The upper GI endoscopy was accomplished without                            difficulty. The patient tolerated the procedure                            well. Scope In: 11:13:39 AM Scope Out: 11:23:32 AM Total Procedure Duration: 0 hours 9 minutes 53 seconds  Findings:      LA Grade A (one or more mucosal breaks less than 5 mm, not extending       between tops of 2 mucosal folds) esophagitis was found 36 cm from the       incisors.      Multiple dispersed erosions were found in the entire examined stomach.       Careful examination of the gastric mucosa on -face and retroflexed       failed to demonstrate a focal lesion as suggested on the recent CT scan.       See multiple photos taken.      Mildly erythematous mucosa was found in the stomach. Biopsies the       abnormal gastric mucosa taken.      Erythematous mucosa and with no stigmata of bleeding was found in the       second portion of the duodenum. Impression:               -  LA Grade A esophagitis.                           - Erosive gastropathy - status post biopsy                           - Erythematous gastricmucosa.                           - Erythematous duodenopathy. no focal mass or other                            focal lesion found on today's examination.                           - Moderate Sedation:      Moderate (conscious) sedation was administered by the endoscopy nurse       and supervised by the endoscopist. The following parameters were       monitored: oxygen saturation, heart rate, blood pressure, respiratory       rate, EKG, adequacy of pulmonary  ventilation, and response to care.       Total physician intraservice time was 17 minutes. Recommendation:           - Patient has a contact number available for                            emergencies. The signs and symptoms of potential                            delayed complications were discussed with the                            patient. Return to normal activities tomorrow.                            Written discharge instructions were provided to the                            patient.                           - Use Protonix (pantoprazole) 40 mg PO daily.                           - Await pathology results.                           - Return to GI office (date not yet determined).                            See colonoscopy report.                           - Advance diet as tolerated.                           -  Continue present medications. Procedure Code(s):        --- Professional ---                           [phone], Esophagogastroduodenoscopy, flexible,                            transoral; diagnostic, including collection of                            specimen(s) by brushing or washing, when performed                            (separate procedure)                           99152, Moderate sedation services provided by the                            same physician or other qualified health care                            professional performing the diagnostic or                            therapeutic service that the sedation supports,                            requiring the presence of an independent trained                            observer to assist in the monitoring of the                            patient's level of consciousness and physiological                            status; initial 15 minutes of intraservice time,                            patient age 53 years or older Diagnosis Code(s):        --- Professional ---                           K20.9,  Esophagitis, unspecified                           K31.89, Other diseases of stomach and duodenum                           R93.3, Abnormal findings on diagnostic imaging of                            other parts of digestive tract CPT copyright 2016 [employer]. All rights reserved. The codes documented in this report are preliminary and upon coder  review may  be revised to meet current compliance requirements. Cristopher Estimable. Mercede Rollo, MD Norvel Richards, MD 10/21/2015 8:39:59 AM This report has been signed electronically. Number of Addenda: 0

## 2015-10-24 ENCOUNTER — Encounter: Payer: Self-pay | Admitting: Internal Medicine

## 2015-10-24 ENCOUNTER — Telehealth: Payer: Self-pay

## 2015-10-24 NOTE — Telephone Encounter (Signed)
APPT MADE AND LETTER SENT  °

## 2015-10-24 NOTE — Telephone Encounter (Signed)
Letter mailed to the pt. 

## 2015-10-24 NOTE — Telephone Encounter (Signed)
Per RMR- Send letter to patient.  Send copy of letter with path to referring provider and PCP.   Needs a follow-up appointment in 6 months to set up to see [place]

## 2015-10-26 ENCOUNTER — Encounter (HOSPITAL_COMMUNITY): Payer: Self-pay | Admitting: Internal Medicine

## 2015-11-01 DIAGNOSIS — Z6822 Body mass index (BMI) 22.0-22.9, adult: Secondary | ICD-10-CM | POA: Diagnosis not present

## 2015-11-01 DIAGNOSIS — J069 Acute upper respiratory infection, unspecified: Secondary | ICD-10-CM | POA: Diagnosis not present

## 2015-11-01 DIAGNOSIS — J209 Acute bronchitis, unspecified: Secondary | ICD-10-CM | POA: Diagnosis not present

## 2015-11-14 DIAGNOSIS — J449 Chronic obstructive pulmonary disease, unspecified: Secondary | ICD-10-CM | POA: Diagnosis not present

## 2015-11-14 DIAGNOSIS — Z23 Encounter for immunization: Secondary | ICD-10-CM | POA: Diagnosis not present

## 2015-11-14 DIAGNOSIS — R5383 Other fatigue: Secondary | ICD-10-CM | POA: Diagnosis not present

## 2015-11-14 DIAGNOSIS — Z6822 Body mass index (BMI) 22.0-22.9, adult: Secondary | ICD-10-CM | POA: Diagnosis not present

## 2015-11-15 ENCOUNTER — Other Ambulatory Visit (HOSPITAL_COMMUNITY): Payer: Self-pay | Admitting: Family Medicine

## 2015-11-15 DIAGNOSIS — R911 Solitary pulmonary nodule: Secondary | ICD-10-CM

## 2015-11-24 DIAGNOSIS — J449 Chronic obstructive pulmonary disease, unspecified: Secondary | ICD-10-CM | POA: Diagnosis not present

## 2015-11-24 DIAGNOSIS — Z6822 Body mass index (BMI) 22.0-22.9, adult: Secondary | ICD-10-CM | POA: Diagnosis not present

## 2015-11-24 DIAGNOSIS — J42 Unspecified chronic bronchitis: Secondary | ICD-10-CM | POA: Diagnosis not present

## 2015-11-29 DIAGNOSIS — J449 Chronic obstructive pulmonary disease, unspecified: Secondary | ICD-10-CM | POA: Diagnosis not present

## 2015-12-15 DIAGNOSIS — J449 Chronic obstructive pulmonary disease, unspecified: Secondary | ICD-10-CM | POA: Diagnosis not present

## 2015-12-19 ENCOUNTER — Ambulatory Visit (HOSPITAL_COMMUNITY)
Admission: RE | Admit: 2015-12-19 | Discharge: 2015-12-19 | Disposition: A | Discharge status: Home / Self Care | Payer: Medicare Other | Source: Ambulatory Visit | Attending: Family Medicine | Admitting: Family Medicine

## 2015-12-19 DIAGNOSIS — R911 Solitary pulmonary nodule: Secondary | ICD-10-CM | POA: Diagnosis not present

## 2015-12-19 DIAGNOSIS — R918 Other nonspecific abnormal finding of lung field: Secondary | ICD-10-CM | POA: Insufficient documentation

## 2015-12-24 DIAGNOSIS — J449 Chronic obstructive pulmonary disease, unspecified: Secondary | ICD-10-CM | POA: Diagnosis not present

## 2016-01-14 DIAGNOSIS — J449 Chronic obstructive pulmonary disease, unspecified: Secondary | ICD-10-CM | POA: Diagnosis not present

## 2016-01-26 DIAGNOSIS — J449 Chronic obstructive pulmonary disease, unspecified: Secondary | ICD-10-CM | POA: Diagnosis not present

## 2016-02-14 DIAGNOSIS — J449 Chronic obstructive pulmonary disease, unspecified: Secondary | ICD-10-CM | POA: Diagnosis not present

## 2016-02-25 DIAGNOSIS — J449 Chronic obstructive pulmonary disease, unspecified: Secondary | ICD-10-CM | POA: Diagnosis not present

## 2016-03-01 ENCOUNTER — Encounter: Payer: Self-pay | Admitting: Internal Medicine

## 2016-03-16 DIAGNOSIS — J449 Chronic obstructive pulmonary disease, unspecified: Secondary | ICD-10-CM | POA: Diagnosis not present

## 2016-03-20 ENCOUNTER — Encounter: Payer: Self-pay | Admitting: Vascular Surgery

## 2016-03-24 DIAGNOSIS — J449 Chronic obstructive pulmonary disease, unspecified: Secondary | ICD-10-CM | POA: Diagnosis not present

## 2016-03-28 ENCOUNTER — Ambulatory Visit (INDEPENDENT_AMBULATORY_CARE_PROVIDER_SITE_OTHER): Payer: Medicare Other | Admitting: Vascular Surgery

## 2016-03-28 ENCOUNTER — Encounter: Payer: Self-pay | Admitting: Vascular Surgery

## 2016-03-28 ENCOUNTER — Ambulatory Visit
Admission: RE | Admit: 2016-03-28 | Discharge: 2016-03-28 | Disposition: A | Discharge status: Home / Self Care | Payer: Medicare Other | Source: Ambulatory Visit | Attending: Vascular Surgery | Admitting: Vascular Surgery

## 2016-03-28 VITALS — BP 132/72 | HR 98 | Temp 97.2°F | Resp 24 | Ht 68.0 in | Wt 142.0 lb

## 2016-03-28 DIAGNOSIS — I713 Abdominal aortic aneurysm, ruptured: Secondary | ICD-10-CM | POA: Diagnosis not present

## 2016-03-28 DIAGNOSIS — I723 Aneurysm of iliac artery: Secondary | ICD-10-CM | POA: Diagnosis not present

## 2016-03-28 DIAGNOSIS — I714 Abdominal aortic aneurysm, without rupture, unspecified: Secondary | ICD-10-CM

## 2016-03-28 MED ORDER — IOPAMIDOL (ISOVUE-370) INJECTION 76%
75.0000 mL | Freq: Once | INTRAVENOUS | Status: AC | PRN
  Administered 2016-03-28: 75 mL via INTRAVENOUS

## 2016-03-28 NOTE — Progress Notes (Signed)
Patient name: Jared Guerrero MRN: [id number] DOB: 1945-10-12 Sex: male  REASON FOR VISIT: Follow up of right common iliac artery aneurysm.  HPI: Jared Guerrero is a 71 y.o. 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 When I last saw him on 09/21/2015, by duplex the maximum diameter of his infrarenal aorta was 3 cm. The maximum diameter of his right common iliac artery aneurysm was 4 cm. We discussed the importance of tobacco cessation and I set him up for a CT angiogram of the abdomen and pelvis in 6 months.  Since I saw him last, he denies any history of abdominal pain or back pain. There have been no changes in his medical history.  He does have a history of COPD and smokes 1 pack per day. He has been smoking for over 50 years. He is followed by Dr. Melvyn Novas.  He also had a recent colonoscopy and had a polyp partially removed and may need further work on this.  He denies any significant claudication or rest pain.  Past Medical History:  Diagnosis Date  . AAA (abdominal aortic aneurysm) ([place])   . Adenomatous polyp   . Bronchitis   . CHF (congestive heart failure) ([place])   . COPD (chronic obstructive pulmonary disease) ([place])   . Diabetes mellitus   . Hyperlipidemia   . Hypertension   . Productive cough     Family History  Problem Relation Age of Onset  . Heart disease Mother 6  . Hyperlipidemia Mother   . Heart attack Mother   . Hypertension Mother   . Heart attack Father 23  . Heart disease Father     before age 40  . Hyperlipidemia Father   . Emphysema Father     was a smoker  . Hypertension Father   . Aneurysm Maternal Grandfather   . Thyroid cancer Brother   . Colon cancer Neg Hx   . Esophageal cancer Neg Hx   . Stomach cancer Neg Hx     SOCIAL HISTORY: Social History  Substance Use Topics  . Smoking status: Current Every Day Smoker    Packs/day: 0.50    Years: 35.00    Types: Cigarettes  . Smokeless tobacco: Never  Used  . Alcohol use No    No Known Allergies  Current Outpatient Prescriptions  Medication Sig Dispense Refill  . albuterol (PROVENTIL) (2.5 MG/3ML) 0.083% nebulizer solution     . amLODipine (NORVASC) 10 MG tablet Take 10 mg by mouth daily.    Marland Kitchen aspirin EC 81 MG tablet Take 81 mg by mouth daily.    . budesonide-formoterol (SYMBICORT) 160-4.5 MCG/ACT inhaler Inhale 2 puffs into the lungs 2 (two) times daily.    . colesevelam (WELCHOL) 625 MG tablet Take 1,875 mg by mouth daily.     Marland Kitchen gabapentin (NEURONTIN) 300 MG capsule Take 1 capsule by mouth at bedtime.    Marland Kitchen ipratropium (ATROVENT) 0.02 % nebulizer solution     . LORazepam (ATIVAN) 1 MG tablet Take 1 mg by mouth daily as needed for anxiety.     Marland Kitchen losartan (COZAAR) 50 MG tablet Take 50 mg by mouth daily.    . pantoprazole (PROTONIX) 40 MG tablet     . polyethylene glycol-electrolytes (TRILYTE) 420 g solution Take 4,000 mLs by mouth as directed. 4000 mL 0  . rosuvastatin (CRESTOR) 5 MG tablet     . tiotropium (SPIRIVA) 18 MCG inhalation capsule Place 18 mcg  into inhaler and inhale daily.     No current facility-administered medications for this visit.     REVIEW OF SYSTEMS:  [X]  denotes positive finding, [ ]  denotes negative finding Cardiac  Comments:  Chest pain or chest pressure:    Shortness of breath upon exertion: X   Short of breath when lying flat:    Irregular heart rhythm:        Vascular    Pain in calf, thigh, or hip brought on by ambulation:    Pain in feet at night that wakes you up from your sleep:     Blood clot in your veins:    Leg swelling:         Pulmonary    Oxygen at home:    Productive cough:     Wheezing:         Neurologic    Sudden weakness in arms or legs:     Sudden numbness in arms or legs:     Sudden onset of difficulty speaking or slurred speech:    Temporary loss of vision in one eye:     Problems with dizziness:         Gastrointestinal    Blood in stool:     Vomited blood:          Genitourinary    Burning when urinating:     Blood in urine:        Psychiatric    Major depression:         Hematologic    Bleeding problems:    Problems with blood clotting too easily:        Skin    Rashes or ulcers:        Constitutional    Fever or chills:      PHYSICAL EXAM: Vitals:   03/28/16 1201  BP: 132/72  Pulse: 98  Resp: (!) 24  Temp: 97.2 F (36.2 C)  TempSrc: Oral  SpO2: 92%  Weight: 142 lb (64.4 kg)  Height: 5\' 8"  (1.727 m)    GENERAL: The patient is a well-nourished male, in no acute distress. The vital signs are documented above. CARDIAC: There is a regular rate and rhythm.  VASCULAR: I do not detect carotid bruits. He has palpable femoral pulses and palpable pedal pulses bilaterally. He has no significant lower extremity swelling. PULMONARY: There is good air exchange bilaterally without wheezing or rales. ABDOMEN: Soft and non-tender with normal pitched bowel sounds.  MUSCULOSKELETAL: There are no major deformities or cyanosis. NEUROLOGIC: No focal weakness or paresthesias are detected. SKIN: There are no ulcers or rashes noted. PSYCHIATRIC: The patient has a normal affect.  DATA:   CT ANGIO ABDOMEN PELVIS:  My interpretation of the films showed that the maximum diameter of the right common iliac artery aneurysm is 4.6 cm. This has not been read by radiology yet. He does not have significant aneurysmal disease of the infrarenal aorta all the way slightly dilated to 3 cm.  MEDICAL ISSUES:  4.6 CM ASYMPTOMATIC RIGHT COMMON ILIAC ARTERY ANEURYSM: Given the continued enlargement of his right common iliac artery aneurysm I have recommended elective repair. He has significant COPD and I think that ideally we should be able to do an endovascular approach. This will likely involve proceeding with an arteriogram and coil embolization of the right internal iliac artery with then staged endovascular aneurysm repair. I have explained that we cannot fix just  the right common iliac artery given that it extends up  to the aortic bifurcation and also to the common iliac artery bifurcation. 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 He will likely also need preoperative pulmonary evaluation. His wife was just hospitalized with cardiac issues and he would like to call back to schedule his procedure. We have discussed the procedure with potential complications and he is agreeable to proceed.    [name] [facility] [place] [phone]

## 2016-04-06 ENCOUNTER — Other Ambulatory Visit: Payer: Self-pay

## 2016-04-13 DIAGNOSIS — J449 Chronic obstructive pulmonary disease, unspecified: Secondary | ICD-10-CM | POA: Diagnosis not present

## 2016-04-16 ENCOUNTER — Other Ambulatory Visit: Payer: Self-pay | Admitting: *Deleted

## 2016-04-16 ENCOUNTER — Encounter (HOSPITAL_COMMUNITY)
Admission: RE | Disposition: A | Discharge status: Home / Self Care | Payer: Self-pay | Source: Ambulatory Visit | Attending: Vascular Surgery

## 2016-04-16 ENCOUNTER — Ambulatory Visit (HOSPITAL_COMMUNITY)
Admission: RE | Admit: 2016-04-16 | Discharge: 2016-04-16 | Disposition: A | Discharge status: Home / Self Care | Payer: Medicare Other | Source: Ambulatory Visit | Attending: Vascular Surgery | Admitting: Vascular Surgery

## 2016-04-16 ENCOUNTER — Encounter (HOSPITAL_COMMUNITY): Payer: Self-pay | Admitting: Vascular Surgery

## 2016-04-16 DIAGNOSIS — I110 Hypertensive heart disease with heart failure: Secondary | ICD-10-CM | POA: Insufficient documentation

## 2016-04-16 DIAGNOSIS — Z8249 Family history of ischemic heart disease and other diseases of the circulatory system: Secondary | ICD-10-CM | POA: Diagnosis not present

## 2016-04-16 DIAGNOSIS — J449 Chronic obstructive pulmonary disease, unspecified: Secondary | ICD-10-CM | POA: Insufficient documentation

## 2016-04-16 DIAGNOSIS — F17210 Nicotine dependence, cigarettes, uncomplicated: Secondary | ICD-10-CM | POA: Diagnosis not present

## 2016-04-16 DIAGNOSIS — I723 Aneurysm of iliac artery: Secondary | ICD-10-CM | POA: Diagnosis not present

## 2016-04-16 DIAGNOSIS — Z01810 Encounter for preprocedural cardiovascular examination: Secondary | ICD-10-CM

## 2016-04-16 DIAGNOSIS — I509 Heart failure, unspecified: Secondary | ICD-10-CM | POA: Insufficient documentation

## 2016-04-16 DIAGNOSIS — Z7982 Long term (current) use of aspirin: Secondary | ICD-10-CM | POA: Diagnosis not present

## 2016-04-16 DIAGNOSIS — E785 Hyperlipidemia, unspecified: Secondary | ICD-10-CM | POA: Diagnosis not present

## 2016-04-16 DIAGNOSIS — E119 Type 2 diabetes mellitus without complications: Secondary | ICD-10-CM | POA: Insufficient documentation

## 2016-04-16 HISTORY — PX: EMBOLIZATION: CATH118239

## 2016-04-16 HISTORY — PX: IR ANGIOGRAM FOLLOW UP STUDY: IMG697

## 2016-04-16 HISTORY — PX: ABDOMINAL AORTOGRAM: CATH118222

## 2016-04-16 HISTORY — PX: LOWER EXTREMITY ANGIOGRAPHY: CATH118251

## 2016-04-16 LAB — POCT I-STAT, CHEM 8
BUN: 20 mg/dL (ref 6–20)
CREATININE: 1 mg/dL (ref 0.61–1.24)
Calcium, Ion: 1.19 mmol/L (ref 1.15–1.40)
Chloride: 106 mmol/L (ref 101–111)
Glucose, Bld: 86 mg/dL (ref 65–99)
HEMATOCRIT: 45 % (ref 39.0–52.0)
HEMOGLOBIN: 15.3 g/dL (ref 13.0–17.0)
Potassium: 4 mmol/L (ref 3.5–5.1)
SODIUM: 143 mmol/L (ref 135–145)
TCO2: 26 mmol/L (ref 0–100)

## 2016-04-16 LAB — GLUCOSE, CAPILLARY: Glucose-Capillary: 74 mg/dL (ref 65–99)

## 2016-04-16 SURGERY — ABDOMINAL AORTOGRAM
Anesthesia: LOCAL | Laterality: Right

## 2016-04-16 MED ORDER — FENTANYL CITRATE (PF) 100 MCG/2ML IJ SOLN
INTRAMUSCULAR | Status: DC | PRN
  Administered 2016-04-16 (×2): 50 ug via INTRAVENOUS

## 2016-04-16 MED ORDER — IODIXANOL 320 MG/ML IV SOLN
INTRAVENOUS | Status: DC | PRN
  Administered 2016-04-16: 170 mL via INTRAVENOUS

## 2016-04-16 MED ORDER — HEPARIN (PORCINE) IN NACL 2-0.9 UNIT/ML-% IJ SOLN
INTRAMUSCULAR | Status: DC | PRN
  Administered 2016-04-16: 1000 mL

## 2016-04-16 MED ORDER — FENTANYL CITRATE (PF) 100 MCG/2ML IJ SOLN
INTRAMUSCULAR | Status: AC
  Filled 2016-04-16: qty 2

## 2016-04-16 MED ORDER — HEPARIN (PORCINE) IN NACL 2-0.9 UNIT/ML-% IJ SOLN
INTRAMUSCULAR | Status: AC
  Filled 2016-04-16: qty 500

## 2016-04-16 MED ORDER — HYDRALAZINE HCL 20 MG/ML IJ SOLN: 10.0000 mg | INTRAMUSCULAR | Status: DC | PRN

## 2016-04-16 MED ORDER — MIDAZOLAM HCL 2 MG/2ML IJ SOLN
INTRAMUSCULAR | Status: AC
  Filled 2016-04-16: qty 2

## 2016-04-16 MED ORDER — MIDAZOLAM HCL 2 MG/2ML IJ SOLN
INTRAMUSCULAR | Status: DC | PRN
  Administered 2016-04-16 (×2): 1 mg via INTRAVENOUS

## 2016-04-16 MED ORDER — SODIUM CHLORIDE 0.9 % IV SOLN: 1.0000 mL/kg/h | INTRAVENOUS | Status: DC

## 2016-04-16 MED ORDER — SODIUM CHLORIDE 0.9 % IV SOLN
INTRAVENOUS | Status: DC
  Administered 2016-04-16: 08:00:00 via INTRAVENOUS

## 2016-04-16 MED ORDER — LIDOCAINE HCL (PF) 1 % IJ SOLN
INTRAMUSCULAR | Status: DC | PRN
  Administered 2016-04-16: 10 mL
  Administered 2016-04-16: 12 mL

## 2016-04-16 MED ORDER — LIDOCAINE HCL (PF) 1 % IJ SOLN
INTRAMUSCULAR | Status: AC
  Filled 2016-04-16: qty 30

## 2016-04-16 SURGICAL SUPPLY — 18 items
CATH ACCU-VU SIZ PIG 5F 70CM (CATHETERS) ×1 IMPLANT
CATH ANGIO 5F BER 100CM (CATHETERS) ×1 IMPLANT
CATH ANGIO 5F BER2 65CM (CATHETERS) ×1 IMPLANT
CATH CROSS OVER TEMPO 5F (CATHETERS) ×1 IMPLANT
CATH STRAIGHT 5FR 65CM (CATHETERS) ×1 IMPLANT
COIL EMBL 14X103.2 LOOP (Vascular Products) IMPLANT
COIL NESTER 14X10 (Vascular Products) ×16 IMPLANT
COIL NESTER 14X12 (Vascular Products) ×4 IMPLANT
DEVICE TORQUE .025-.038 (MISCELLANEOUS) ×2 IMPLANT
GUIDEWIRE ANGLED .035X150CM (WIRE) ×1 IMPLANT
GUIDEWIRE ANGLED .035X260CM (WIRE) ×1 IMPLANT
KIT PV (KITS) ×4 IMPLANT
SHEATH PINNACLE 5F 10CM (SHEATH) ×2 IMPLANT
SYR MEDRAD MARK V 150ML (SYRINGE) ×4 IMPLANT
TRANSDUCER W/STOPCOCK (MISCELLANEOUS) ×4 IMPLANT
TRAY PV CATH (CUSTOM PROCEDURE TRAY) ×4 IMPLANT
WIRE BENTSON .035X145CM (WIRE) ×2 IMPLANT
WIRE MINI STICK MAX (SHEATH) ×1 IMPLANT

## 2016-04-16 NOTE — Progress Notes (Signed)
Pt had a 5Fr sheath in the Right femoral artery as well as a 5Fr sheath in the Left femoral artery. Both sheaths were pulled and manual pressure was held for 20 minutes for each sheath. Pre/post site assessment is a level 0 for both the right and left groin. Pedal pulses palpated on both the right and left. Both right and left groin sites were dressed with gauze and tegaderm.  Vitals remained WNL throughout the process. Post instructions given to pt. No complications noted. Pt will be returning to [facility] shortly.

## 2016-04-16 NOTE — Op Note (Signed)
PATIENT: Jared Guerrero  MRN: [id number] DOB: 1945/03/04    DATE OF PROCEDURE: 04/16/2016  INDICATIONS: Jared Guerrero is a 71 y.o. male with a 4.6 cm right common iliac artery aneurysm. This has enlarged and we're considering elective endovascular aneurysm repair. Given that the aneurysm extends close to the common iliac artery bifurcation I recommended coil embolization of the right internal iliac artery prior to his endovascular aneurysm repair.  PROCEDURE:  1. Conscious sedation 2. Ultrasound-guided access to the right common femoral artery 3. Aortogram and bilateral iliac arteriogram 4. Ultrasound-guided access to the left common femoral artery 5. Coil embolization of the right hypogastric artery using 8 Nestor coils 6. Bilateral lower extremity runoff  SURGEON: Judeth Cornfield. Jared Dock, MD, FACS  ANESTHESIA: Local with sedation   EBL: Minimal  TECHNIQUE: The patient was taken to the peripheral vascular lab and was sedated. The period of conscious sedation was 1 hour and 53 minutes.  During that time period, I was present face-to-face 100% of the time.  The patient was administered 1 mg of Versed and 50 g of fentanyl. He later received an additional 1 mg of Versed and 50 g of fentanyl.. The patient's heart rate, blood pressure, and oxygen saturation were monitored by the nurse continuously during the procedure.  Both groins were prepped and draped in usual sterile fashion. Under ultrasound guidance, after the skin was anesthetized, the right common femoral artery was cannulated with a micropuncture needle and a micropuncture sheath introduced over the wire. This was exchanged for a 5 [place] sheath over a [employer] wire. Flush your Phillip Heal was obtained and then the cath was positioned above the aortic bifurcation and oblique iliac projections were obtained. I attempted to cannulate the right hypogastric artery from the right but was never able to advance the wire down into the  hypogastric artery. For this reason I elected to try from a left-sided approach.  Under ultrasound guidance, after the skin was anesthetized, the left common femoral artery was cannulated with a micropuncture needle and a micro-puncture sheath introduced over a wire. This was exchanged for 5 [place] sheath over a [employer] wire. These across [place] catheter to engage the bifurcation and then advanced the angled Glidewire down into the external iliac artery on the right. I exchanged the crossover catheter for the Berenstein catheter and this was used to engage the origin of the hypogastric artery which did have a significant calcified plaque. However I was able to get this down into a large secondary branch. I then advanced a straight catheter over the wire down into this large secondary branch.  I placed 3 12 mm Nestor coils in this large branch. The wire was then advanced into the other secondary branch and the catheter advanced. I then placed 310 mm Nestor coils in this branch. In the main trunk I placed another 12 mm coil and another 10 mm coil. This showed occlusion of the hypogastric artery at the completion.  Next the pigtail catheter was positioned above the aortic bifurcation and bilateral lower extremity runoff films were obtained.  FINDINGS:  1. Single renal arteries bilaterally. The left renal artery is the lowest of the 2 renal arteries. The infrarenal aorta is widely patent and slightly ectatic but not frankly aneurysmal. The distance from the lowest renal to the bifurcation is 9.5 cm. 2. Large right common iliac artery aneurysm. The right hypogastric artery was successfully coil embolized as described above. The right external iliac artery is patent. On the right side  the common femoral, deep femoral, superficial femoral, popliteal, anterior tibial, tibioperoneal trunk, posterior tibial and peroneal arteries are patent. 3. On the left side the common iliac, external iliac and hypogastric  arteries are patent. The common femoral, deep femoral, superficial femoral, popliteal, anterior tibial, tibioperoneal trunk, peroneal, posterior tibial arteries are patent.  CLINICAL NOTE: The patient is undergoing preoperative pulmonary evaluation and cardiac clearance before scheduling his elective endovascular aneurysm repair.   [name], MD, FACS [facility]  DATE OF DICTATION:   04/16/2016

## 2016-04-16 NOTE — Discharge Instructions (Signed)
Femoral Site Care °Refer to this sheet in the next few weeks. These instructions provide you with information about caring for yourself after your procedure. Your health care provider may also give you more specific instructions. Your treatment has been planned according to current medical practices, but problems sometimes occur. Call your health care provider if you have any problems or questions after your procedure. °What can I expect after the procedure? °After your procedure, it is typical to have the following: °· Bruising at the site that usually fades within 1-2 weeks. °· Blood collecting in the tissue (hematoma) that may be painful to the touch. It should usually decrease in size and tenderness within 1-2 weeks. °Follow these instructions at home: °· Take medicines only as directed by your health care provider. °· You may shower 24-48 hours after the procedure or as directed by your health care provider. Remove the bandage (dressing) and gently wash the site with plain soap and water. Pat the area dry with a clean towel. Do not rub the site, because this may cause bleeding. °· Do not take baths, swim, or use a hot tub until your health care provider approves. °· Check your insertion site every day for redness, swelling, or drainage. °· Do not apply powder or lotion to the site. °· Limit use of stairs to twice a day for the first 2-3 days or as directed by your health care provider. °· Do not squat for the first 2-3 days or as directed by your health care provider. °· Do not lift over 10 lb (4.5 kg) for 5 days after your procedure or as directed by your health care provider. °· Ask your health care provider when it is okay to: °¨ Return to work or school. °¨ Resume usual physical activities or sports. °¨ Resume sexual activity. °· Do not drive home if you are discharged the same day as the procedure. Have someone else drive you. °· You may drive 24 hours after the procedure unless otherwise instructed by  your health care provider. °· Do not operate machinery or power tools for 24 hours after the procedure or as directed by your health care provider. °· If your procedure was done as an outpatient procedure, which means that you went home the same day as your procedure, a responsible adult should be with you for the first 24 hours after you arrive home. °· Keep all follow-up visits as directed by your health care provider. This is important. °Contact a health care provider if: °· You have a fever. °· You have chills. °· You have increased bleeding from the site. Hold pressure on the site. °Get help right away if: °· You have unusual pain at the site. °· You have redness, warmth, or swelling at the site. °· You have drainage (other than a small amount of blood on the dressing) from the site. °· The site is bleeding, and the bleeding does not stop after 30 minutes of holding steady pressure on the site. °· Your leg or foot becomes pale, cool, tingly, or numb. °This information is not intended to replace advice given to you by your health care provider. Make sure you discuss any questions you have with your health care provider. °Document Released: 09/11/2013 Document Revised: 06/16/2015 Document Reviewed: 07/28/2013 °Elsevier Interactive Patient Education © 2017 [employer] ° °

## 2016-04-16 NOTE — H&P (View-Only) (Signed)
Patient name: Jared Guerrero MRN: [id number] DOB: [birth year] Sex: male  REASON FOR VISIT: Follow up of right common iliac artery aneurysm.  HPI: Jared Guerrero is a 71 y.o. 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 When I last saw him on 09/21/2015, by duplex the maximum diameter of his infrarenal aorta was 3 cm. The maximum diameter of his right common iliac artery aneurysm was 4 cm. We discussed the importance of tobacco cessation and I set him up for a CT angiogram of the abdomen and pelvis in 6 months.  Since I saw him last, he denies any history of abdominal pain or back pain. There have been no changes in his medical history.  He does have a history of COPD and smokes 1 pack per day. He has been smoking for over 50 years. He is followed by Dr. Melvyn Novas.  He also had a recent colonoscopy and had a polyp partially removed and may need further work on this.  He denies any significant claudication or rest pain.  Past Medical History:  Diagnosis Date  . AAA (abdominal aortic aneurysm) ([place])   . Adenomatous polyp   . Bronchitis   . CHF (congestive heart failure) ([place])   . COPD (chronic obstructive pulmonary disease) ([place])   . Diabetes mellitus   . Hyperlipidemia   . Hypertension   . Productive cough     Family History  Problem Relation Age of Onset  . Heart disease Mother 76  . Hyperlipidemia Mother   . Heart attack Mother   . Hypertension Mother   . Heart attack Father 12  . Heart disease Father     before age 22  . Hyperlipidemia Father   . Emphysema Father     was a smoker  . Hypertension Father   . Aneurysm Maternal Grandfather   . Thyroid cancer Brother   . Colon cancer Neg Hx   . Esophageal cancer Neg Hx   . Stomach cancer Neg Hx     SOCIAL HISTORY: Social History  Substance Use Topics  . Smoking status: Current Every Day Smoker    Packs/day: 0.50    Years: 35.00    Types: Cigarettes  . Smokeless tobacco: Never  Used  . Alcohol use No    No Known Allergies  Current Outpatient Prescriptions  Medication Sig Dispense Refill  . albuterol (PROVENTIL) (2.5 MG/3ML) 0.083% nebulizer solution     . amLODipine (NORVASC) 10 MG tablet Take 10 mg by mouth daily.    Marland Kitchen aspirin EC 81 MG tablet Take 81 mg by mouth daily.    . budesonide-formoterol (SYMBICORT) 160-4.5 MCG/ACT inhaler Inhale 2 puffs into the lungs 2 (two) times daily.    . colesevelam (WELCHOL) 625 MG tablet Take 1,875 mg by mouth daily.     Marland Kitchen gabapentin (NEURONTIN) 300 MG capsule Take 1 capsule by mouth at bedtime.    Marland Kitchen ipratropium (ATROVENT) 0.02 % nebulizer solution     . LORazepam (ATIVAN) 1 MG tablet Take 1 mg by mouth daily as needed for anxiety.     Marland Kitchen losartan (COZAAR) 50 MG tablet Take 50 mg by mouth daily.    . pantoprazole (PROTONIX) 40 MG tablet     . polyethylene glycol-electrolytes (TRILYTE) 420 g solution Take 4,000 mLs by mouth as directed. 4000 mL 0  . rosuvastatin (CRESTOR) 5 MG tablet     . tiotropium (SPIRIVA) 18 MCG inhalation capsule Place 18 mcg  into inhaler and inhale daily.     No current facility-administered medications for this visit.     REVIEW OF SYSTEMS:  [X]  denotes positive finding, [ ]  denotes negative finding Cardiac  Comments:  Chest pain or chest pressure:    Shortness of breath upon exertion: X   Short of breath when lying flat:    Irregular heart rhythm:        Vascular    Pain in calf, thigh, or hip brought on by ambulation:    Pain in feet at night that wakes you up from your sleep:     Blood clot in your veins:    Leg swelling:         Pulmonary    Oxygen at home:    Productive cough:     Wheezing:         Neurologic    Sudden weakness in arms or legs:     Sudden numbness in arms or legs:     Sudden onset of difficulty speaking or slurred speech:    Temporary loss of vision in one eye:     Problems with dizziness:         Gastrointestinal    Blood in stool:     Vomited blood:          Genitourinary    Burning when urinating:     Blood in urine:        Psychiatric    Major depression:         Hematologic    Bleeding problems:    Problems with blood clotting too easily:        Skin    Rashes or ulcers:        Constitutional    Fever or chills:      PHYSICAL EXAM: Vitals:   03/28/16 1201  BP: 132/72  Pulse: 98  Resp: (!) 24  Temp: 97.2 F (36.2 C)  TempSrc: Oral  SpO2: 92%  Weight: 142 lb (64.4 kg)  Height: 5\' 8"  (1.727 m)    GENERAL: The patient is a well-nourished male, in no acute distress. The vital signs are documented above. CARDIAC: There is a regular rate and rhythm.  VASCULAR: I do not detect carotid bruits. He has palpable femoral pulses and palpable pedal pulses bilaterally. He has no significant lower extremity swelling. PULMONARY: There is good air exchange bilaterally without wheezing or rales. ABDOMEN: Soft and non-tender with normal pitched bowel sounds.  MUSCULOSKELETAL: There are no major deformities or cyanosis. NEUROLOGIC: No focal weakness or paresthesias are detected. SKIN: There are no ulcers or rashes noted. PSYCHIATRIC: The patient has a normal affect.  DATA:   CT ANGIO ABDOMEN PELVIS:  My interpretation of the films showed that the maximum diameter of the right common iliac artery aneurysm is 4.6 cm. This has not been read by radiology yet. He does not have significant aneurysmal disease of the infrarenal aorta all the way slightly dilated to 3 cm.  MEDICAL ISSUES:  4.6 CM ASYMPTOMATIC RIGHT COMMON ILIAC ARTERY ANEURYSM: Given the continued enlargement of his right common iliac artery aneurysm I have recommended elective repair. He has significant COPD and I think that ideally we should be able to do an endovascular approach. This will likely involve proceeding with an arteriogram and coil embolization of the right internal iliac artery with then staged endovascular aneurysm repair. I have explained that we cannot fix just  the right common iliac artery given that it extends up  to the aortic bifurcation and also to the common iliac artery bifurcation. 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 He will likely also need preoperative pulmonary evaluation. His wife was just hospitalized with cardiac issues and he would like to call back to schedule his procedure. We have discussed the procedure with potential complications and he is agreeable to proceed.    [name] [facility] [place] [phone]

## 2016-04-16 NOTE — Interval H&P Note (Signed)
History and Physical Interval Note:  04/16/2016 9:06 AM  Jared Guerrero  has presented today for surgery, with the diagnosis of right illac artery anorizium  The various methods of treatment have been discussed with the patient and family. After consideration of risks, benefits and other options for treatment, the patient has consented to  Procedure(s): Abdominal Aortogram w/Lower Extremity (N/A) as a surgical intervention .  The patient's history has been reviewed, patient examined, no change in status, stable for surgery.  I have reviewed the patient's chart and labs.  Questions were answered to the patient's satisfaction.     [name]

## 2016-04-18 ENCOUNTER — Ambulatory Visit: Payer: Medicare Other | Admitting: Adult Health

## 2016-04-23 ENCOUNTER — Ambulatory Visit: Payer: Medicare Other | Admitting: Nurse Practitioner

## 2016-04-23 NOTE — Progress Notes (Signed)
Cardiology Office Note   Date:  04/24/2016   ID:  Jared Guerrero, DOB [birth year], MRN [id number]  PCP:  Purvis Kilts, MD  Cardiologist:   Jenkins Rouge, MD   No chief complaint on file.     History of Present Illness: Jared Guerrero is a 71 y.o. male who presents for evaluation/consultation at the request of Dr Doren Custard. 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 Smokes a ppd and has COPD Sees Dr Melvyn Novas. CRF;s include DM, elevated lipids and HTN No AAA no claudication  He has no history of coronary artery disease. No chest pain Has not had stress test in past. No bleeding diathesis or previous issues with anesthesia  He has poor dentition. He has some stress as His wife has some psychologic issues and he has to take her to ER all the time    Past Medical History:  Diagnosis Date  . AAA (abdominal aortic aneurysm) ([place])   . Adenomatous polyp   . Bronchitis   . CHF (congestive heart failure) ([place])   . COPD (chronic obstructive pulmonary disease) ([place])   . Diabetes mellitus   . Hyperlipidemia   . Hypertension   . Productive cough     Past Surgical History:  Procedure Laterality Date  . ABDOMINAL AORTOGRAM N/A 04/16/2016   Procedure: Abdominal Aortogram;  Surgeon: Angelia Mould, MD;  Location: [facility];  Service: Cardiovascular;  Laterality: N/A;  . BIOPSY  10/20/2015   Procedure: BIOPSY;  Surgeon: Daneil Dolin, MD;  [facility];  Service: Endoscopy;;  gastric  . COLONOSCOPY  06/17/00   Dr. Vivi Ferns rectum, diminutive polyp in the sigmoid, flat polyp in the cecum, partially removed- adenomatous polyp  . COLONOSCOPY  09/16/00   Dr. Gala Romney- normal appearing rectum, clot overlying a polypectomy site with oozing in the cecum , this lesion was treated, small cecal polyps= tubular adenoma  . COLONOSCOPY N/A 10/20/2015   Procedure:  COLONOSCOPY;  Surgeon: Daneil Dolin, MD;  [facility];  Service: Endoscopy;  Laterality: N/A;  12:00 PM  . EMBOLIZATION Right 04/16/2016   Procedure: Embolization;  Surgeon: Angelia Mould, MD;  Location: [facility];  Service: Cardiovascular;  Laterality: Right;  internal iliac  . ESOPHAGOGASTRODUODENOSCOPY N/A 10/20/2015   Procedure: ESOPHAGOGASTRODUODENOSCOPY (EGD);  Surgeon: Daneil Dolin, MD;  [facility];  Service: Endoscopy;  Laterality: N/A;  . FOOT SURGERY  2005   right foot   . LOWER EXTREMITY ANGIOGRAPHY Bilateral 04/16/2016   Procedure: Lower Extremity Angiography;  Surgeon: Angelia Mould, MD;  Location: [facility];  Service: Cardiovascular;  Laterality: Bilateral;  . POLYPECTOMY  10/20/2015   Procedure: POLYPECTOMY;  Surgeon: Daneil Dolin, MD;  [facility];  Service: Endoscopy;;  colon  . SPINE SURGERY  1987   L4-5 diskectomy     Current Outpatient Prescriptions  Medication Sig Dispense Refill  . albuterol (PROVENTIL HFA;VENTOLIN HFA) 108 (90 Base) MCG/ACT inhaler Inhale 2 puffs into the lungs every 6 (six) hours as needed for wheezing or shortness of breath.    Marland Kitchen albuterol (PROVENTIL) (2.5 MG/3ML) 0.083% nebulizer solution Take 2.5 mg by nebulization every 6 (six) hours as needed for wheezing or shortness of breath.     Marland Kitchen amLODipine (NORVASC) 5 MG tablet Take 5 mg by mouth every evening.    Marland Kitchen  aspirin EC 81 MG tablet Take 162 mg by mouth daily.     . budesonide-formoterol (SYMBICORT) 160-4.5 MCG/ACT inhaler Inhale 2 puffs into the lungs 2 (two) times daily.    . colesevelam (WELCHOL) 625 MG tablet Take 625 mg by mouth 2 (two) times daily.     Marland Kitchen gabapentin (NEURONTIN) 300 MG capsule Take 300 mg by mouth at bedtime.     Marland Kitchen ipratropium (ATROVENT) 0.02 % nebulizer solution Take 0.5 mg by nebulization every 6 (six) hours as needed for wheezing or shortness of breath.     Marland Kitchen LORazepam (ATIVAN) 1 MG tablet Take 0.5 mg by  mouth daily as needed for anxiety.     Marland Kitchen losartan (COZAAR) 50 MG tablet Take 50 mg by mouth daily.    . pantoprazole (PROTONIX) 40 MG tablet Take 40 mg by mouth daily before supper.     Vladimir Faster Glycol-Propyl Glycol (SYSTANE OP) Apply 1 drop to eye daily as needed (dry eyes).    . rosuvastatin (CRESTOR) 5 MG tablet Take 5 mg by mouth daily with supper.     . tiotropium (SPIRIVA) 18 MCG inhalation capsule Place 18 mcg into inhaler and inhale daily.     No current facility-administered medications for this visit.     Allergies:   Patient has no known allergies.    Social History:  The patient  reports that he has been smoking Cigarettes.  He has a 17.50 pack-year smoking history. He has never used smokeless tobacco. He reports that he does not drink alcohol or use drugs.   Family History:  The patient's family history includes Aneurysm in his maternal grandfather; Emphysema in his father; Heart attack in his mother; Heart attack (age of onset: 61) in his father; Heart disease in his father; Heart disease (age of onset: 45) in his mother; Hyperlipidemia in his father and mother; Hypertension in his father and mother; Thyroid cancer in his brother.    ROS:  Please see the history of present illness.   Otherwise, review of systems are positive for none.   All other systems are reviewed and negative.    PHYSICAL EXAM: VS:  BP 118/68 (BP Location: Right Arm)   Pulse 98   Ht 5\' 8"  (1.727 m)   Wt 140 lb (63.5 kg)   SpO2 96%   BMI 21.29 kg/m  , BMI Body mass index is 21.29 kg/m. Affect appropriate Chronically ill male  HEENT: normal Neck supple with no adenopathy JVP normal no bruits no thyromegaly Lungs clear with no wheezing and good diaphragmatic motion Heart:  S1/S2 no murmur, no rub, gallop or click PMI normal Abdomen: benighn, BS positve, no tenderness, no AAA no bruit.  No HSM or HJR Distal pulses intact with no bruits No edema Neuro non-focal Skin warm and dry No muscular  weakness Right foot with neurpathy and healed ulcer     EKG:  04/16/16  SR rate 93 RBBB    Recent Labs: 04/16/2016: BUN 20; Creatinine, Ser 1.00; Hemoglobin 15.3; Potassium 4.0; Sodium 143    Lipid Panel No results found for: CHOL, TRIG, HDL, CHOLHDL, VLDL, LDLCALC, LDLDIRECT    Wt Readings from Last 3 Encounters:  04/24/16 140 lb (63.5 kg)  04/16/16 142 lb (64.4 kg)  03/28/16 142 lb (64.4 kg)      Other studies Reviewed: Additional studies/ records that were reviewed today include: Notes from dr Doren Custard recent angiogram and ECG .    ASSESSMENT AND PLAN:  1.  Pre operative  evaluation Smoker with poor activity level needing vascular surgery will order lexiscan myovue  2. Smoking/COPD sees Dr Melvyn Novas soon has had [place] programs for smoking cessation but failed no active wheezing 3. Vascular no evidence AAA or other aneurysms except right iliac for endovascular repair with Dr Doren Custard 4. HTN.nlbp 5. Cholesterol on statin labs with primary  Asked him to inquire of Dr Doren Custard if he needs dental work before stenting of right iliac to decrease risk of infection    Current medicines are reviewed at length with the patient today.  The patient does not have concerns regarding medicines.  The following changes have been made:  no change  Labs/ tests ordered today include: Lex Myovue   Orders Placed This Encounter  Procedures  . NM Myocar Multi W/Spect W/Wall Motion / EF     Disposition:   FU with me in a year      Signed, Jenkins Rouge, MD  04/24/2016 1:46 PM    [facility] [address], [place], [place]  [address] Phone: [phone]; Fax: [phone]

## 2016-04-24 ENCOUNTER — Encounter: Payer: Self-pay | Admitting: Cardiovascular Disease

## 2016-04-24 ENCOUNTER — Ambulatory Visit (INDEPENDENT_AMBULATORY_CARE_PROVIDER_SITE_OTHER): Payer: Medicare Other | Admitting: Cardiovascular Disease

## 2016-04-24 VITALS — BP 118/68 | HR 98 | Ht 68.0 in | Wt 140.0 lb

## 2016-04-24 DIAGNOSIS — Z01818 Encounter for other preprocedural examination: Secondary | ICD-10-CM | POA: Diagnosis not present

## 2016-04-24 DIAGNOSIS — J449 Chronic obstructive pulmonary disease, unspecified: Secondary | ICD-10-CM | POA: Diagnosis not present

## 2016-04-24 NOTE — Patient Instructions (Signed)
Medication Instructions:  Your physician recommends that you continue on your current medications as directed. Please refer to the Current Medication list given to you today.   Labwork: none  Testing/Procedures: Your physician has requested that you have a lexiscan myoview. For further information please visit [email or web address]. Please follow instruction sheet, as given.    Follow-Up: Your physician wants you to follow-up in: 1 year.  You will receive a reminder letter in the mail two months in advance. If you don't receive a letter, please call our office to schedule the follow-up appointment.   Any Other Special Instructions Will Be Listed Below (If Applicable).     If you need a refill on your cardiac medications before your next appointment, please call your pharmacy.

## 2016-05-01 ENCOUNTER — Ambulatory Visit (INDEPENDENT_AMBULATORY_CARE_PROVIDER_SITE_OTHER): Payer: Medicare Other | Admitting: Adult Health

## 2016-05-01 ENCOUNTER — Encounter: Payer: Self-pay | Admitting: Adult Health

## 2016-05-01 VITALS — BP 142/84 | HR 103 | Ht 68.0 in | Wt 141.0 lb

## 2016-05-01 DIAGNOSIS — I723 Aneurysm of iliac artery: Secondary | ICD-10-CM

## 2016-05-01 DIAGNOSIS — R938 Abnormal findings on diagnostic imaging of other specified body structures: Secondary | ICD-10-CM | POA: Diagnosis not present

## 2016-05-01 DIAGNOSIS — J449 Chronic obstructive pulmonary disease, unspecified: Secondary | ICD-10-CM

## 2016-05-01 DIAGNOSIS — Z01818 Encounter for other preprocedural examination: Secondary | ICD-10-CM | POA: Diagnosis not present

## 2016-05-01 DIAGNOSIS — R9389 Abnormal findings on diagnostic imaging of other specified body structures: Secondary | ICD-10-CM

## 2016-05-01 NOTE — Progress Notes (Signed)
Chart and office note reviewed in detail  > agree with a/p as outlined    

## 2016-05-01 NOTE — Assessment & Plan Note (Signed)
Cont follow up with Vascular surgeon.

## 2016-05-01 NOTE — Assessment & Plan Note (Signed)
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 Recent CTa Abd/pelvis with enlarging iliac aneurysm at 4.8cm . He has been recommended for an elective endovascular repair of the right iliac (found initially in 2008-  Originally at 3.1cm) Spirometry shows sharp decline in FEV1 - would like to get full set of PFT.  CT chest (recent CT chest w/ abn area in lung bases )  Will give recommendations once Test results are back.

## 2016-05-01 NOTE — Patient Instructions (Addendum)
Continue on Symbicort and Spiriva  Set up for CT chest w/o contrast  Set up PFT this week.  follow up Dr. Melvyn Novas  In 3 months and As needed   Will be in touch regarding test results later this week.

## 2016-05-01 NOTE — Progress Notes (Signed)
@Patient  ID: Jared Guerrero, male    DOB: December 09, 1945, 71 y.o.   MRN: [id number]  Chief Complaint  Patient presents with  . Follow-up    COPD     Referring provider: Sharilyn Sites, MD  HPI: 71 yo male smoker followed for COPD   TEST  CT chest 11/2015 >resolved R mid lung opacity , Mild patchy GGO in lungs bases , severe emphysema , cystic changes in right mid lung , bronchiectasis .     05/01/2016  Follow up ; COPD/Surgical clearance  Pt returns for follow up for COPD . Last seen in office 12/2014 .  Says overall breathing is doing okay with no flare of cough or wheezing .  Remains on Symbicort and Spiriva . Still smoking 1 PPD. Smoking cessation discussed.  No recent abx or prednisone . No ER /Hospital visit for breathing .  Goes to [facility] . Gets meds thru them.  Lost 30 lbs in last 4 yr .  (says under a lot of stress)  Says he is Sedentary for last 1 yr. Drives, does housework. Mows grass/rider.  Gets sob walking long distance, stairs .  Intermittent wheeze /cough. No hemoptysis .   CT chest in 11/2015 showed Mild patchy GGO in lung bases , severe emphysmea , right mid lung cystic changes . Recommended to have a follow up CT chest in 3 months .   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 Recent CTa Abd/pelvis with enlarging iliac aneurysm at 4.8cm . He has been recommended for an elective endovascular repair of the right iliac (found initially in 2008-  Originally at 3.1cm) he is here for a preop pulmonary evaluation   Also seen by cardiology last week. Set up for stress test this week as preop eval.  Recommended dental referral . This is being planned by pt.    No Known Allergies  Immunization History  Administered Date(s) Administered  . Influenza Split 10/22/2014  . Influenza Whole 11/23/2011  . Influenza, High Dose Seasonal PF 10/23/2015  .  Pneumococcal Conjugate-13 01/22/2013    Past Medical History:  Diagnosis Date  . AAA (abdominal aortic aneurysm) ([place])   . Adenomatous polyp   . Bronchitis   . CHF (congestive heart failure) ([place])   . COPD (chronic obstructive pulmonary disease) ([place])   . Diabetes mellitus   . Hyperlipidemia   . Hypertension   . Productive cough     Tobacco History: History  Smoking Status  . Current Every Day Smoker  . Packs/day: 0.75  . Years: 45.00  . Types: Cigarettes  Smokeless Tobacco  . Never Used   Ready to quit: Not Answered Counseling given: Not Answered   Outpatient Encounter Prescriptions as of 05/01/2016  Medication Sig  . albuterol (PROVENTIL HFA;VENTOLIN HFA) 108 (90 Base) MCG/ACT inhaler Inhale 2 puffs into the lungs every 6 (six) hours as needed for wheezing or shortness of breath.  Marland Kitchen albuterol (PROVENTIL) (2.5 MG/3ML) 0.083% nebulizer solution Take 2.5 mg by nebulization every 6 (six) hours as needed for wheezing or shortness of breath.   Marland Kitchen amLODipine (NORVASC) 5 MG tablet Take 5 mg by mouth every evening.  Marland Kitchen aspirin EC 81 MG tablet Take 162 mg by mouth daily.   . budesonide-formoterol (SYMBICORT) 160-4.5 MCG/ACT inhaler Inhale 2 puffs into the lungs 2 (two) times daily.  . colesevelam [facility])  625 MG tablet Take 625 mg by mouth 2 (two) times daily.   Marland Kitchen gabapentin (NEURONTIN) 300 MG capsule Take 300 mg by mouth at bedtime.   Marland Kitchen ipratropium (ATROVENT) 0.02 % nebulizer solution Take 0.5 mg by nebulization every 6 (six) hours as needed for wheezing or shortness of breath.   Marland Kitchen LORazepam (ATIVAN) 1 MG tablet Take 0.5 mg by mouth daily as needed for anxiety.   Marland Kitchen losartan (COZAAR) 50 MG tablet Take 50 mg by mouth daily.  . pantoprazole (PROTONIX) 40 MG tablet Take 40 mg by mouth daily before supper.   Vladimir Faster Glycol-Propyl Glycol (SYSTANE OP) Apply 1 drop to eye daily as needed (dry eyes).  . rosuvastatin (CRESTOR) 5 MG tablet Take 5 mg by mouth daily with supper.   .  tiotropium (SPIRIVA) 18 MCG inhalation capsule Place 18 mcg into inhaler and inhale daily.   No facility-administered encounter medications on file as of 05/01/2016.      Review of Systems  Constitutional:   No  weight loss, night sweats,  Fevers, chills,  +fatigue, or  lassitude.  HEENT:   No headaches,  Difficulty swallowing,  Tooth/dental problems, or  Sore throat,                No sneezing, itching, ear ache, nasal congestion, post nasal drip,   CV:  No chest pain,  Orthopnea, PND, swelling in lower extremities, anasarca, dizziness, palpitations, syncope.   GI  No heartburn, indigestion, abdominal pain, nausea, vomiting, diarrhea, change in bowel habits, loss of appetite, bloody stools.   Resp: .  No chest wall deformity  Skin: no rash or lesions.  GU: no dysuria, change in color of urine, no urgency or frequency.  No flank pain, no hematuria   MS:  No joint pain or swelling.  No decreased range of motion.  No back pain.    Physical Exam  BP (!) 142/84 (BP Location: Right Arm, Cuff Size: Normal)   Pulse (!) 103   Ht 5\' 8"  (1.727 m)   Wt 141 lb (64 kg)   SpO2 96%   BMI 21.44 kg/m   GEN: A/Ox3; pleasant , NAD, thin    HEENT:  [place]/AT,  EACs-clear, TMs-wnl, NOSE-clear, THROAT-clear, no lesions, no postnasal drip or exudate noted. Poor dentition   NECK:  Supple w/ fair ROM; no JVD; normal carotid impulses w/o bruits; no thyromegaly or nodules palpated; no lymphadenopathy.    RESP  Decreased BS in bases , no accessory muscle use, no dullness to percussion  CARD:  RRR, no m/r/g, no peripheral edema, pulses intact, no cyanosis or clubbing.  GI:   Soft & nt; nml bowel sounds; no organomegaly or masses detected.   Musco: Warm bil, no deformities or joint swelling noted.   Neuro: alert, no focal deficits noted.    Skin: Warm, no lesions or rashes    Lab Results:  CBC    Component Value Date/Time   WBC 11.6 (H) 12/19/2011 0924   RBC 4.85 12/19/2011 0924   HGB  15.3 04/16/2016 0812   HCT 45.0 04/16/2016 0812   PLT 306.0 12/19/2011 0924   MCV 92.3 12/19/2011 0924   MCHC 33.6 12/19/2011 0924   RDW 14.5 12/19/2011 0924   LYMPHSABS 2.7 12/19/2011 0924   MONOABS 0.8 12/19/2011 0924   EOSABS 0.5 12/19/2011 0924   BASOSABS 0.1 12/19/2011 0924    BMET    Component Value Date/Time   NA 143 04/16/2016 0812   K 4.0 04/16/2016  3009   CL 106 04/16/2016 0812   CO2 24 04/20/2012 1137   GLUCOSE 86 04/16/2016 0812   BUN 20 04/16/2016 0812   CREATININE 1.00 04/16/2016 0812   CREATININE 1.12 01/24/2011 1348   CALCIUM 9.8 04/20/2012 1137   GFRNONAA 87 (L) 04/20/2012 1137   GFRAA >90 04/20/2012 1137    BNP No results found for: BNP  ProBNP No results found for: PROBNP  Imaging: No results found.   Assessment & Plan:   No problem-specific Assessment & Plan notes found for this encounter.     Rexene Edison, NP 05/01/2016

## 2016-05-01 NOTE — Assessment & Plan Note (Addendum)
Severe COPD GOLD III -sharp decline in FEV over last 4 yr here for surgical clearance for endovascular  Since he has such a dramatic decline in FEV1 would check PFT  Also check CT chest to look at this area of GGO /cycstic changes to make sure not worsening  Agree with dental referral .  Await cardiac workup .  Case discussed with Dr. Melvyn Novas  , once test results are back can better comment on surgical risks   Plan Patient Instructions  Continue on Symbicort and Spiriva  Set up for CT chest w/o contrast  Set up PFT this week.  follow up Dr. Melvyn Novas  In 3 months and As needed   Will be in touch regarding test results later this week.

## 2016-05-01 NOTE — Assessment & Plan Note (Signed)
Repeat CT chest

## 2016-05-03 ENCOUNTER — Encounter (HOSPITAL_COMMUNITY)
Admission: RE | Admit: 2016-05-03 | Discharge: 2016-05-03 | Disposition: A | Discharge status: Home / Self Care | Payer: Medicare Other | Source: Ambulatory Visit | Attending: Cardiovascular Disease | Admitting: Cardiovascular Disease

## 2016-05-03 ENCOUNTER — Encounter (HOSPITAL_COMMUNITY): Payer: Self-pay

## 2016-05-03 ENCOUNTER — Encounter: Payer: Self-pay | Admitting: Vascular Surgery

## 2016-05-03 ENCOUNTER — Inpatient Hospital Stay (HOSPITAL_COMMUNITY): Admission: RE | Admit: 2016-05-03 | Payer: Medicare Other | Source: Ambulatory Visit

## 2016-05-03 DIAGNOSIS — Z01818 Encounter for other preprocedural examination: Secondary | ICD-10-CM | POA: Diagnosis not present

## 2016-05-03 DIAGNOSIS — F17210 Nicotine dependence, cigarettes, uncomplicated: Secondary | ICD-10-CM | POA: Insufficient documentation

## 2016-05-03 HISTORY — DX: Unspecified asthma, uncomplicated: J45.909

## 2016-05-03 LAB — NM MYOCAR MULTI W/SPECT W/WALL MOTION / EF
CHL CUP NUCLEAR SDS: 1
CHL CUP NUCLEAR SRS: 2
CHL CUP NUCLEAR SSS: 3
LHR: 0.39
LV dias vol: 113 mL (ref 62–150)
LV sys vol: 48 mL
Peak HR: 105 {beats}/min
Rest HR: 75 {beats}/min
TID: 1.02

## 2016-05-03 MED ORDER — REGADENOSON 0.4 MG/5ML IV SOLN
INTRAVENOUS | Status: AC
  Administered 2016-05-03: 0.4 mg via INTRAVENOUS
  Filled 2016-05-03: qty 5

## 2016-05-03 MED ORDER — SODIUM CHLORIDE 0.9% FLUSH
INTRAVENOUS | Status: AC
  Administered 2016-05-03: 10 mL via INTRAVENOUS
  Filled 2016-05-03: qty 10

## 2016-05-03 MED ORDER — TECHNETIUM TC 99M TETROFOSMIN IV KIT
30.0000 | PACK | Freq: Once | INTRAVENOUS | Status: AC | PRN
  Administered 2016-05-03: 32 via INTRAVENOUS

## 2016-05-03 MED ORDER — TECHNETIUM TC 99M TETROFOSMIN IV KIT
10.0000 | PACK | Freq: Once | INTRAVENOUS | Status: AC | PRN
  Administered 2016-05-03: 11 via INTRAVENOUS

## 2016-05-04 ENCOUNTER — Telehealth: Payer: Self-pay

## 2016-05-04 NOTE — Telephone Encounter (Signed)
-----   Message from Josue Hector, MD sent at 05/03/2016 11:25 PM EDT ----- Normal myovue study with no evidence of ischemia or infarction

## 2016-05-04 NOTE — Telephone Encounter (Signed)
Called pt, he is not available right now. I will try after 1.

## 2016-05-11 ENCOUNTER — Ambulatory Visit (HOSPITAL_COMMUNITY)
Admission: RE | Admit: 2016-05-11 | Discharge: 2016-05-11 | Disposition: A | Discharge status: Home / Self Care | Payer: Medicare Other | Source: Ambulatory Visit | Attending: Adult Health | Admitting: Adult Health

## 2016-05-11 DIAGNOSIS — I700 Atherosclerosis of aorta: Secondary | ICD-10-CM | POA: Diagnosis not present

## 2016-05-11 DIAGNOSIS — J439 Emphysema, unspecified: Secondary | ICD-10-CM | POA: Diagnosis not present

## 2016-05-11 DIAGNOSIS — J984 Other disorders of lung: Secondary | ICD-10-CM | POA: Insufficient documentation

## 2016-05-11 DIAGNOSIS — R911 Solitary pulmonary nodule: Secondary | ICD-10-CM | POA: Diagnosis not present

## 2016-05-11 DIAGNOSIS — J449 Chronic obstructive pulmonary disease, unspecified: Secondary | ICD-10-CM | POA: Diagnosis not present

## 2016-05-11 DIAGNOSIS — I2510 Atherosclerotic heart disease of native coronary artery without angina pectoris: Secondary | ICD-10-CM | POA: Diagnosis not present

## 2016-05-11 MED ORDER — ALBUTEROL SULFATE (2.5 MG/3ML) 0.083% IN NEBU
2.5000 mg | INHALATION_SOLUTION | Freq: Once | RESPIRATORY_TRACT | Status: AC
  Administered 2016-05-11: 2.5 mg via RESPIRATORY_TRACT

## 2016-05-13 LAB — PULMONARY FUNCTION TEST
DL/VA % PRED: 61 %
DL/VA: 2.76 ml/min/mmHg/L
DLCO COR: 14.3 ml/min/mmHg
DLCO UNC % PRED: 48 %
DLCO cor % pred: 48 %
DLCO unc: 14.3 ml/min/mmHg
FEF 25-75 POST: 0.56 L/s
FEF 25-75 Pre: 0.38 L/sec
FEF2575-%Change-Post: 44 %
FEF2575-%PRED-POST: 25 %
FEF2575-%PRED-PRE: 17 %
FEV1-%Change-Post: 12 %
FEV1-%PRED-POST: 38 %
FEV1-%Pred-Pre: 34 %
FEV1-Post: 1.13 L
FEV1-Pre: 1 L
FEV1FVC-%Change-Post: 0 %
FEV1FVC-%PRED-PRE: 65 %
FEV6-%CHANGE-POST: 13 %
FEV6-%Pred-Post: 60 %
FEV6-%Pred-Pre: 53 %
FEV6-PRE: 2.03 L
FEV6-Post: 2.3 L
FEV6FVC-%CHANGE-POST: 0 %
FEV6FVC-%Pred-Post: 102 %
FEV6FVC-%Pred-Pre: 102 %
FVC-%Change-Post: 13 %
FVC-%Pred-Post: 58 %
FVC-%Pred-Pre: 52 %
FVC-Post: 2.38 L
FVC-Pre: 2.1 L
POST FEV1/FVC RATIO: 47 %
PRE FEV1/FVC RATIO: 48 %
Post FEV6/FVC ratio: 97 %
Pre FEV6/FVC Ratio: 97 %
RV % pred: 208 %
RV: 4.94 L
TLC % pred: 113 %
TLC: 7.51 L

## 2016-05-14 DIAGNOSIS — J449 Chronic obstructive pulmonary disease, unspecified: Secondary | ICD-10-CM | POA: Diagnosis not present

## 2016-05-14 NOTE — Progress Notes (Signed)
Spoke with the pt and scheduled appt with MW for 05/17/16

## 2016-05-17 ENCOUNTER — Ambulatory Visit (INDEPENDENT_AMBULATORY_CARE_PROVIDER_SITE_OTHER): Payer: Medicare Other | Admitting: Internal Medicine

## 2016-05-17 ENCOUNTER — Encounter: Payer: Self-pay | Admitting: Internal Medicine

## 2016-05-17 VITALS — BP 160/90 | HR 109 | Ht 68.0 in | Wt 139.0 lb

## 2016-05-17 DIAGNOSIS — I10 Essential (primary) hypertension: Secondary | ICD-10-CM | POA: Diagnosis not present

## 2016-05-17 DIAGNOSIS — F17210 Nicotine dependence, cigarettes, uncomplicated: Secondary | ICD-10-CM | POA: Diagnosis not present

## 2016-05-17 DIAGNOSIS — J449 Chronic obstructive pulmonary disease, unspecified: Secondary | ICD-10-CM

## 2016-05-17 NOTE — Progress Notes (Signed)
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  rec trial off acei    12/26/2011 Jared Guerrero [name]. --pt reports symptoms remains since last visit 12/19/11 and are worsening-- ON last visit ACE was stopped & benicar substituted. -has had a couple of episodes of awakening at night w extreme SOB , using proair 4x per day and unsure whether to keep using if he has already done so, would like to know if pft can be done today  Spirometry showed moderate airway obstruction -fev1 48%, fvc 74%, ratio 51 rec You have moderate COPD -lung function at 48% Stay on spiriva Add symbicort 160- 2 puffs twice daily Use rescue inhaler every 6h  as needed  01/31/2012 f/u ov/Jared Guerrero cc breathing better to his satisfaction and not needing rescue inhaler more than twice daily at most "out of habit, not need".  rec Plan A is spiriva and symbicort 2 every 12 hours but as you improve you may be able to taper off the symbicort  Plan B proaire - only use if needed for short breath/ cough / wheeze The key is to stop smoking completely before smoking completely stops you - it's not too late  Benicar 20 mg one half daily   05/16/2012 f/u ov/Jared Guerrero re copd still smoking  Chief Complaint  Patient presents with  . Follow-up    Pt states breathing is doing well, no new co's  today.   no limiting sob though not very active, not needing saba at all rec Work on inhaler technique:   Spiriva daily and if any symptoms ok add symbcort 1-2 every 12 hours       12/10/2014   ov/Jared Guerrero re:  aecopd in active smoker with GOLD II criteria at baseline maint on spiriva and symbicort  Chief Complaint  Patient presents with  . Acute Visit    Pt c/o increased cough for the past few months. Cough is prod with greyish white sputum. He has noticed he coughs more when he lies down. His breathing  has been progressively worse and he has been using albuterol HFA 3 x daily on average.    baseline not using saba at all then gradually worse x sev months with doe x adls Cough worse first thing in the morning and takes up to an hour to clear it rec zpak and Prednisone 10 mg take  4 each am x 2 days,   2 each am x 2 days,  1 each am x 2 days and stop  Change spiriva to respimat - two puff each am Work on inhaler technique   01/21/2015  f/u ov/Jared Guerrero re: GOLD II/ still smoking maint rx with symb/spiriva  Chief Complaint  Patient presents with  . Follow-up    Pt states his  breathing has improved some. He is coughing less. Cough is prod with "dingy" green sputum. He is using albuterol inhaler 2 x daily on average.   really liked the spiriva respimat but [facility] not [facility] Needing saba up to twice daily / no noct use / worse if out in cold or windy conditions rec Symbicort and some form  Of spiriva daily  Only use your albuterol as a rescue medication    05/17/2016  f/u ov/Jared Guerrero re:   GOLD III copd/ still smoking  Chief Complaint  Patient presents with  . Follow-up    PFT's and CT Chest done 05/11/16. Pt states needing surgery cleanance for AA repair. Surgery will be performed by Dr. Doren Custard. He states his breathing is unchanged since the last visit. He is coughing with white sputum and wheezing some, "pollen is not helping".  He is using albuterol inhaler 2 x daily on average and  neb with albuterol 3 x per wk on average.   using symbicort 160 2 puff / spiriva one daily  Only uses albuterol when overdoes it with activity   Doe x  MMRC1 = can walk nl pace, flat grade, can't hurry or go uphills or steps s sob     No obvious day to day or daytime variability or assoc excess/ purulent sputum or mucus plugs or hemoptysis or cp or chest tightness,  or overt  hb symptoms. No unusual exp hx or h/o childhood pna/ asthma or knowledge of premature birth.  Sleeping ok without nocturnal  or early am exacerbation  of respiratory  c/o's or need for noct saba. Also denies any obvious fluctuation of symptoms with weather or environmental changes or other aggravating or alleviating factors except as outlined above   Current Medications, Allergies, Complete Past Medical History, Past Surgical History, Family History, and Social History were reviewed in [employer] record.  ROS  The following are not active complaints unless bolded sore throat, dysphagia, dental problems, itching, sneezing,  nasal congestion or excess/ purulent secretions, ear ache,   fever, chills, sweats, unintended wt loss, classically pleuritic or exertional cp,  orthopnea pnd or leg swelling, presyncope, palpitations, abdominal pain, anorexia, nausea, vomiting, diarrhea  or change in bowel or bladder habits, change in stools or urine, dysuria,hematuria,  rash, arthralgias, visual complaints, headache, numbness, weakness or ataxia or problems with walking or coordination,  change in mood/affect or memory.               Objective:   Physical Exam  Stoic amb wm/ mildly congested sounding cough  05/16/2012   162 > 12/10/2014 156 > 01/21/2015   156 > 05/17/16  139     01/31/12 163 lb (73.936 kg)  12/26/11 157 lb 3.2 oz (71.305 kg)  12/19/11 159 lb 12.8 oz (72.485 kg)    HEENT: nl dentition, turbinates bilaterally, and oropharynx. Nl external ear canals without cough reflex   NECK :  without  JVD/Nodes/TM/ nl carotid upstrokes bilaterally   LUNGS: no acc muscle use,  slt barrel contour chest, min late exp wheeze bilaterally    CV:  RRR  no s3 or murmur or increase in P2, and no edema   ABD:  soft and nontender with  Pos hoover at mid insp  in the supine position. No bruits or organomegaly appreciated, bowel sounds nl  MS:  Nl gait/ ext warm without deformities, calf tenderness, cyanosis or clubbing No obvious joint restrictions   SKIN: warm and  dry without lesions    NEURO:  alert, approp, nl sensorium with  no motor or cerebellar deficits apparent.           I personally reviewed images and agree with radiology impression as follows:  CT s contrast  Chest  05/11/16 Resolution of patchy density seen at the lung bases consistent with resolved pneumonia. Background pattern of advanced emphysema, upper lobe predominant. Mild linear scarring at the right lung base. No evidence of mass or infiltrate at this time that requires further follow-up.  Advanced coronary artery calcification.  Aortic atherosclerosis.   Assessment & Plan:

## 2016-05-17 NOTE — Patient Instructions (Addendum)
Plan A = Automatic = symbicort 160 x 2 puffs every 12 hours and spiriva follow symbiort just in am   Work on inhaler technique:  relax and gently blow all the way out then take a nice smooth deep breath back in, triggering the inhaler at same time you start breathing in.  Hold for up to 5 seconds if you can. Blow out thru nose. Rinse and gargle with water when done      Plan B = Backup Only use your albuterol as a rescue medication to be used if you can't catch your breath by resting or doing a relaxed purse lip breathing pattern.  - The less you use it, the better it will work when you need it. - Ok to use the inhaler up to 2 puffs  every 4 hours if you must but call for appointment if use goes up over your usual need - Don't leave home without it !!  (think of it like the spare tire for your car)   Plan C = Crisis - only use your albuterol nebulizer if you first try Plan B and it fails to help > ok to use the nebulizer up to every 4 hours but if start needing it regularly call for immediate appointment     No smoking preop  ideally x 2 weeks    You are cleared for surgery but will have a higher risk of pulmonary complications that you can reduce but not eliminate by following the above prior to surgery   Please schedule a follow up office visit in 6 weeks, call sooner if needed

## 2016-05-20 ENCOUNTER — Encounter: Payer: Self-pay | Admitting: Internal Medicine

## 2016-05-20 NOTE — Assessment & Plan Note (Signed)
-   PFT's 01/31/2012 FEV1  2.15 (73%)  With ratio 56 and no better with  and dlco 84% - 12/10/2014    try respimat spiriva > improved but [facility] would not pay for it  - PFT's  05/11/2016  FEV1 1.13 (38 % ) ratio 47  p 12 % improvement from saba p ? prior to study with DLCO  48/48c % corrects to 61  % for alv volume    - 05/17/2016  After extensive coaching HFA effectiveness =    90%   He has had substantial decline since last pfts while actively smoking and now faces risk of aneurysm surgery.   While there is no defined lower limit of fev1 for non-thoracic surgery, I explained there was a definite risk of pulmonary complication he could reduce by stopping smoking and taking meds perfectly regularly pre op,  Mobilization and minimization of narcs post op, and pulmonary f/u as intpt prn  He is cleared for surgery with these stipulations  I had an extended discussion with the patient reviewing all relevant studies completed to date and  lasting 15 to 20 minutes of a 25 minute visit    Each maintenance medication was reviewed in detail including most importantly the difference between maintenance and prns and under what circumstances the prns are to be triggered using an action plan format that is not reflected in the computer generated alphabetically organized AVS.    Please see AVS for specific instructions unique to this visit that I personally wrote and verbalized to the the pt in detail and then reviewed with pt  by my nurse highlighting any  changes in therapy recommended at today's visit to their plan of care.

## 2016-05-20 NOTE — Assessment & Plan Note (Signed)
>   3 min  Discussion of life or breath issues/ still not fully committed to quitting but will do his best to cut down pre-op

## 2016-05-20 NOTE — Assessment & Plan Note (Signed)
Not optimally controlled ? Adherent > ok to add BB but   prefer in this setting: Bystolic, the most beta -1  selective Beta blocker available in sample form, with bisoprolol the most selective generic choice  on the market.

## 2016-05-22 DIAGNOSIS — J449 Chronic obstructive pulmonary disease, unspecified: Secondary | ICD-10-CM | POA: Diagnosis not present

## 2016-05-22 DIAGNOSIS — Z6821 Body mass index (BMI) 21.0-21.9, adult: Secondary | ICD-10-CM | POA: Diagnosis not present

## 2016-05-24 ENCOUNTER — Other Ambulatory Visit: Payer: Self-pay

## 2016-05-25 DIAGNOSIS — J449 Chronic obstructive pulmonary disease, unspecified: Secondary | ICD-10-CM | POA: Diagnosis not present

## 2016-06-06 ENCOUNTER — Encounter (HOSPITAL_COMMUNITY)
Admission: RE | Admit: 2016-06-06 | Discharge: 2016-06-06 | Disposition: A | Discharge status: Home / Self Care | Payer: Medicare Other | Source: Ambulatory Visit | Attending: Vascular Surgery | Admitting: Vascular Surgery

## 2016-06-06 ENCOUNTER — Encounter (HOSPITAL_COMMUNITY): Payer: Self-pay

## 2016-06-06 DIAGNOSIS — Z01812 Encounter for preprocedural laboratory examination: Secondary | ICD-10-CM | POA: Insufficient documentation

## 2016-06-06 HISTORY — DX: Anxiety disorder, unspecified: F41.9

## 2016-06-06 HISTORY — DX: Gastro-esophageal reflux disease without esophagitis: K21.9

## 2016-06-06 LAB — CBC
HCT: 44.2 % (ref 39.0–52.0)
HEMOGLOBIN: 15.3 g/dL (ref 13.0–17.0)
MCH: 29.8 pg (ref 26.0–34.0)
MCHC: 34.6 g/dL (ref 30.0–36.0)
MCV: 86 fL (ref 78.0–100.0)
PLATELETS: 280 10*3/uL (ref 150–400)
RBC: 5.14 MIL/uL (ref 4.22–5.81)
RDW: 15.8 % — ABNORMAL HIGH (ref 11.5–15.5)
WBC: 11 10*3/uL — ABNORMAL HIGH (ref 4.0–10.5)

## 2016-06-06 LAB — URINALYSIS, ROUTINE W REFLEX MICROSCOPIC
BACTERIA UA: NONE SEEN
Bilirubin Urine: NEGATIVE
Glucose, UA: NEGATIVE mg/dL
Ketones, ur: NEGATIVE mg/dL
Leukocytes, UA: NEGATIVE
Nitrite: NEGATIVE
PROTEIN: NEGATIVE mg/dL
SQUAMOUS EPITHELIAL / LPF: NONE SEEN
Specific Gravity, Urine: 1.003 — ABNORMAL LOW (ref 1.005–1.030)
pH: 6 (ref 5.0–8.0)

## 2016-06-06 LAB — ABO/RH: ABO/RH(D): O NEG

## 2016-06-06 LAB — BLOOD GAS, ARTERIAL
Acid-base deficit: 2.2 mmol/L — ABNORMAL HIGH (ref 0.0–2.0)
Bicarbonate: 22 mmol/L (ref 20.0–28.0)
DRAWN BY: 449841
FIO2: 0.21
O2 SAT: 95.6 %
PCO2 ART: 37 mmHg (ref 32.0–48.0)
PO2 ART: 76.8 mmHg — AB (ref 83.0–108.0)
Patient temperature: 98.6
pH, Arterial: 7.392 (ref 7.350–7.450)

## 2016-06-06 LAB — APTT: aPTT: 33 seconds (ref 24–36)

## 2016-06-06 LAB — COMPREHENSIVE METABOLIC PANEL
ALK PHOS: 79 U/L (ref 38–126)
ALT: 22 U/L (ref 17–63)
ANION GAP: 9 (ref 5–15)
AST: 27 U/L (ref 15–41)
Albumin: 3.9 g/dL (ref 3.5–5.0)
BUN: 15 mg/dL (ref 6–20)
CALCIUM: 9.4 mg/dL (ref 8.9–10.3)
CO2: 18 mmol/L — AB (ref 22–32)
Chloride: 107 mmol/L (ref 101–111)
Creatinine, Ser: 0.79 mg/dL (ref 0.61–1.24)
GFR calc non Af Amer: >60 mL/min (ref >60)
Glucose, Bld: 97 mg/dL (ref 65–99)
Potassium: 4 mmol/L (ref 3.5–5.1)
SODIUM: 134 mmol/L — AB (ref 135–145)
Total Bilirubin: 0.7 mg/dL (ref 0.3–1.2)
Total Protein: 7.1 g/dL (ref 6.5–8.1)

## 2016-06-06 LAB — TYPE AND SCREEN
ABO/RH(D): O NEG
ANTIBODY SCREEN: NEGATIVE

## 2016-06-06 LAB — PROTIME-INR
INR: 1.02
PROTHROMBIN TIME: 13.4 s (ref 11.4–15.2)

## 2016-06-06 LAB — SURGICAL PCR SCREEN
MRSA, PCR: NEGATIVE
Staphylococcus aureus: NEGATIVE

## 2016-06-06 MED ORDER — CHLORHEXIDINE GLUCONATE CLOTH 2 % EX PADS: 6.0000 | MEDICATED_PAD | Freq: Once | CUTANEOUS | Status: DC

## 2016-06-06 NOTE — Pre-Procedure Instructions (Signed)
Jared Guerrero  06/06/2016      [facility], [address] [place] [address] Phone: [phone] Fax: [phone]    Your procedure is scheduled on Thurs, May 24 @ 7:30 AM  Report to [facility] Admitting at 5:30 AM  Call this number if you have problems the morning of surgery:  [phone]   Remember:  Do not eat food or drink liquids after midnight.  Take these medicines the morning of surgery with A SIP OF WATER Albuterol<Bring Your Inhaler With You>,Albuterol Neb treatment,Symbicort<Bring Your Inhaler>,and Spiriva<Bring Your Inhaler With You>             Stop taking any Vitamins or Herbal Medications. No Goody's,BC's,Aleve,Advil,Motrin,Ibuprofen,Aspirin,Fish Oil,or any Herbal Medications.    Do not wear jewelry.  Do not wear lotions, powders,colognes or deoderant.  Men may shave face and neck.  Do not bring valuables to the hospital.  [facility] is not responsible for any belongings or valuables.  Contacts, dentures or bridgework may not be worn into surgery.  Leave your suitcase in the car.  After surgery it may be brought to your room.  For patients admitted to the hospital, discharge time will be determined by your treatment team.  Patients discharged the day of surgery will not be allowed to drive home.    [facility]  Before surgery, you can play an important role.  Because skin is not sterile, your skin needs to be as free of germs as possible.  You can reduce the number of germs on you skin by washing with CHG (chlorahexidine gluconate) soap before surgery.  CHG is an antiseptic cleaner which kills germs and bonds with the skin to continue killing germs even after washing.  Please DO NOT use if you have an allergy to CHG or antibacterial soaps.  If your skin becomes reddened/irritated stop using the CHG and inform your nurse when you arrive at Short Stay.  Do  not shave (including legs and underarms) for at least 48 hours prior to the first CHG shower.  You may shave your face.  Please follow these instructions carefully:   1.  Shower with CHG Soap the night before surgery and the                                morning of Surgery.  2.  If you choose to wash your hair, wash your hair first as usual with your       normal shampoo.  3.  After you shampoo, rinse your hair and body thoroughly to remove the                      Shampoo.  4.  Use CHG as you would any other liquid soap.  You can apply chg directly       to the skin and wash gently with scrungie or a clean washcloth.  5.  Apply the CHG Soap to your body ONLY FROM THE NECK DOWN.        Do not use on open wounds or open sores.  Avoid contact with your eyes,       ears, mouth and genitals (private parts).  Wash genitals (private parts)       with your normal soap.  6.  Wash thoroughly, paying special attention to  the area where your surgery        will be performed.  7.  Thoroughly rinse your body with warm water from the neck down.  8.  DO NOT shower/wash with your normal soap after using and rinsing off       the CHG Soap.  9.  Pat yourself dry with a clean towel.            10.  Wear clean pajamas.            11.  Place clean sheets on your bed the night of your first shower and do not        sleep with pets.  Day of Surgery  Do not apply any lotions/deoderants the morning of surgery.  Please wear clean clothes to the hospital/surgery center.    Please read over the following fact sheets that you were given. Pain Booklet, Coughing and Deep Breathing, MRSA Information and Surgical Site Infection Prevention

## 2016-06-06 NOTE — Progress Notes (Signed)
Cardiologist is Dr.Nishan with last visit in epic from 04/24/16  Medical Md is Dr.John Hilma Favors  Echo  Stress test report in epic from 1998/05/04/18  EKG in epic from 04-16-16  Chest CT in epic from 05-09-16

## 2016-06-07 NOTE — Progress Notes (Signed)
Anesthesia Chart Review:  Pt is a 71 year old male scheduled for abdominal aortic endovascular stent graft on 06/14/2016 with [name], MD  - PCP is Sharilyn Sites, MD  - Pulmonologist is Christinia Gully, MD, last office visit 05/17/16. He cleared pt for surgery noting: "While there is no defined lower limit of fev1 for non-thoracic surgery, I explained there was a definite risk of pulmonary complication he could reduce by stopping smoking and taking meds perfectly regularly pre op,  Mobilization and minimization of narcs post op, and pulmonary f/u as intpt prn"  - Saw cardiologist Jenkins Rouge, MD for pre-op evaluation. Stress test ordered, results below.   PMH includes: HTN, hyperlipidemia, AAA, asthma, COPD. Current smoker. BMI 21.  Medications include: Albuterol, amlodipine, ASA 81 mg, Symbicort, WelChol, Atrovent, losartan, Protonix, rosuvastatin, Spiriva  Preoperative labs reviewed.    CT chest 05/11/16:  - Resolution of patchy density seen at the lung bases consistent with resolved pneumonia. Background pattern of advanced emphysema, upper lobe predominant. Mild linear scarring at the right lung base. No evidence of mass or infiltrate at this time that requires further follow-up. - Advanced coronary artery calcification.  Aortic atherosclerosis.  EKG 04/16/16: NSR. RBBB  Nuclear stress test 05/03/16:   There was no ST segment deviation noted during stress.  The study is normal. Inferior wall defect most consistent with subdiaphragmatic attenuation and artifact due to adjacent gut radiotracer uptake.  This is a low risk study.  The left ventricular ejection fraction is normal (55-65%).  Abdominal aortogram 04/16/16:  1. Single renal arteries bilaterally. The left renal artery is the lowest of the 2 renal arteries. The infrarenal aorta is widely patent and slightly ectatic but not frankly aneurysmal. The distance from the lowest renal to the bifurcation is 9.5 cm. 2. Large  right common iliac artery aneurysm. The right hypogastric artery was successfully coil embolized as described above. The right external iliac artery is patent. On the right side the common femoral, deep femoral, superficial femoral, popliteal, anterior tibial, tibioperoneal trunk, posterior tibial and peroneal arteries are patent. 3. On the left side the common iliac, external iliac and hypogastric arteries are patent. The common femoral, deep femoral, superficial femoral, popliteal, anterior tibial, tibioperoneal trunk, peroneal, posterior tibial arteries are patent.  If no changes, I anticipate pt can proceed with surgery as scheduled.   Willeen Cass, FNP-BC [facility]/Anesthesiology Phone: [phone] 06/07/2016 2:29 PM

## 2016-06-13 DIAGNOSIS — J449 Chronic obstructive pulmonary disease, unspecified: Secondary | ICD-10-CM | POA: Diagnosis not present

## 2016-06-13 NOTE — Anesthesia Preprocedure Evaluation (Addendum)
Anesthesia Evaluation  Patient identified by MRN, date of birth, ID band Patient awake    Reviewed: Allergy & Precautions, H&P , NPO status , Patient's Chart, lab work & pertinent test results  Airway Mallampati: II  TM Distance: >3 FB Neck ROM: Full    Dental no notable dental hx. (+) Teeth Intact, Dental Advisory Given, Chipped,    Pulmonary asthma , COPD,  COPD inhaler, Current Smoker,    Pulmonary exam normal breath sounds clear to auscultation       Cardiovascular Exercise Tolerance: Good hypertension, Pt. on medications + Peripheral Vascular Disease   Rhythm:Regular Rate:Normal     Neuro/Psych Anxiety negative neurological ROS  negative psych ROS   GI/Hepatic Neg liver ROS, GERD  Controlled,  Endo/Other  negative endocrine ROS  Renal/GU negative Renal ROS  negative genitourinary   Musculoskeletal   Abdominal   Peds  Hematology negative hematology ROS (+)   Anesthesia Other Findings   Reproductive/Obstetrics negative OB ROS                           Anesthesia Physical Anesthesia Plan  ASA: III  Anesthesia Plan: General   Post-op Pain Management:    Induction: Intravenous  Airway Management Planned: Oral ETT  Additional Equipment: Arterial line  Intra-op Plan:   Post-operative Plan: Extubation in OR and Possible Post-op intubation/ventilation  Informed Consent: I have reviewed the patients History and Physical, chart, labs and discussed the procedure including the risks, benefits and alternatives for the proposed anesthesia with the patient or authorized representative who has indicated his/her understanding and acceptance.   Dental advisory given  Plan Discussed with: CRNA  Anesthesia Plan Comments:         Anesthesia Quick Evaluation

## 2016-06-14 ENCOUNTER — Other Ambulatory Visit: Payer: Self-pay | Admitting: *Deleted

## 2016-06-14 ENCOUNTER — Telehealth: Payer: Self-pay | Admitting: Vascular Surgery

## 2016-06-14 ENCOUNTER — Encounter (HOSPITAL_COMMUNITY)
Admission: RE | Disposition: A | Discharge status: Home / Self Care | Payer: Self-pay | Source: Ambulatory Visit | Attending: Vascular Surgery

## 2016-06-14 ENCOUNTER — Inpatient Hospital Stay (HOSPITAL_COMMUNITY)
Admission: RE | Admit: 2016-06-14 | Discharge: 2016-06-15 | DRG: 272 | Disposition: A | Discharge status: Home / Self Care | Payer: Medicare Other | Source: Ambulatory Visit | Attending: Vascular Surgery | Admitting: Vascular Surgery

## 2016-06-14 ENCOUNTER — Inpatient Hospital Stay (HOSPITAL_COMMUNITY): Payer: Medicare Other | Admitting: Emergency Medicine

## 2016-06-14 ENCOUNTER — Encounter (HOSPITAL_COMMUNITY): Payer: Self-pay

## 2016-06-14 ENCOUNTER — Inpatient Hospital Stay (HOSPITAL_COMMUNITY): Payer: Medicare Other | Admitting: Certified Registered"

## 2016-06-14 DIAGNOSIS — I10 Essential (primary) hypertension: Secondary | ICD-10-CM | POA: Diagnosis not present

## 2016-06-14 DIAGNOSIS — I110 Hypertensive heart disease with heart failure: Secondary | ICD-10-CM | POA: Diagnosis not present

## 2016-06-14 DIAGNOSIS — K219 Gastro-esophageal reflux disease without esophagitis: Secondary | ICD-10-CM | POA: Diagnosis present

## 2016-06-14 DIAGNOSIS — I723 Aneurysm of iliac artery: Secondary | ICD-10-CM

## 2016-06-14 DIAGNOSIS — F17210 Nicotine dependence, cigarettes, uncomplicated: Secondary | ICD-10-CM | POA: Diagnosis present

## 2016-06-14 DIAGNOSIS — Z8679 Personal history of other diseases of the circulatory system: Secondary | ICD-10-CM

## 2016-06-14 DIAGNOSIS — I509 Heart failure, unspecified: Secondary | ICD-10-CM | POA: Diagnosis not present

## 2016-06-14 DIAGNOSIS — I714 Abdominal aortic aneurysm, without rupture, unspecified: Secondary | ICD-10-CM

## 2016-06-14 DIAGNOSIS — Z7982 Long term (current) use of aspirin: Secondary | ICD-10-CM

## 2016-06-14 DIAGNOSIS — Z79899 Other long term (current) drug therapy: Secondary | ICD-10-CM

## 2016-06-14 DIAGNOSIS — F419 Anxiety disorder, unspecified: Secondary | ICD-10-CM | POA: Diagnosis present

## 2016-06-14 DIAGNOSIS — J449 Chronic obstructive pulmonary disease, unspecified: Secondary | ICD-10-CM | POA: Diagnosis present

## 2016-06-14 DIAGNOSIS — Z95828 Presence of other vascular implants and grafts: Secondary | ICD-10-CM

## 2016-06-14 HISTORY — PX: ABDOMINAL AORTIC ENDOVASCULAR STENT GRAFT: SHX5707

## 2016-06-14 LAB — BASIC METABOLIC PANEL
Anion gap: 7 (ref 5–15)
BUN: 11 mg/dL (ref 6–20)
CALCIUM: 8.5 mg/dL — AB (ref 8.9–10.3)
CO2: 22 mmol/L (ref 22–32)
Chloride: 109 mmol/L (ref 101–111)
Creatinine, Ser: 0.8 mg/dL (ref 0.61–1.24)
GFR calc Af Amer: >60 mL/min (ref >60)
GLUCOSE: 103 mg/dL — AB (ref 65–99)
Potassium: 3.6 mmol/L (ref 3.5–5.1)
Sodium: 138 mmol/L (ref 135–145)

## 2016-06-14 LAB — CBC
HCT: 39.9 % (ref 39.0–52.0)
Hemoglobin: 13.4 g/dL (ref 13.0–17.0)
MCH: 29.3 pg (ref 26.0–34.0)
MCHC: 33.6 g/dL (ref 30.0–36.0)
MCV: 87.1 fL (ref 78.0–100.0)
Platelets: 242 10*3/uL (ref 150–400)
RBC: 4.58 MIL/uL (ref 4.22–5.81)
RDW: 16 % — AB (ref 11.5–15.5)
WBC: 12.3 10*3/uL — ABNORMAL HIGH (ref 4.0–10.5)

## 2016-06-14 LAB — PROTIME-INR
INR: 1.15
Prothrombin Time: 14.8 seconds (ref 11.4–15.2)

## 2016-06-14 LAB — APTT: aPTT: 39 seconds — ABNORMAL HIGH (ref 24–36)

## 2016-06-14 LAB — MAGNESIUM: Magnesium: 1.7 mg/dL (ref 1.7–2.4)

## 2016-06-14 SURGERY — INSERTION, ENDOVASCULAR STENT GRAFT, AORTA, ABDOMINAL
Anesthesia: General | Site: Abdomen

## 2016-06-14 MED ORDER — 0.9 % SODIUM CHLORIDE (POUR BTL) OPTIME
TOPICAL | Status: DC | PRN
  Administered 2016-06-14: 1000 mL

## 2016-06-14 MED ORDER — DOCUSATE SODIUM 100 MG PO CAPS
100.0000 mg | ORAL_CAPSULE | Freq: Every day | ORAL | Status: DC
  Administered 2016-06-15: 100 mg via ORAL
  Filled 2016-06-14: qty 1

## 2016-06-14 MED ORDER — AMLODIPINE BESYLATE 5 MG PO TABS
5.0000 mg | ORAL_TABLET | Freq: Every evening | ORAL | Status: DC
  Administered 2016-06-14: 5 mg via ORAL
  Filled 2016-06-14: qty 1

## 2016-06-14 MED ORDER — SODIUM CHLORIDE 0.9 % IV SOLN
INTRAVENOUS | Status: DC | PRN
  Administered 2016-06-14: 08:00:00

## 2016-06-14 MED ORDER — ASPIRIN EC 81 MG PO TBEC
162.0000 mg | DELAYED_RELEASE_TABLET | Freq: Every day | ORAL | Status: DC
  Administered 2016-06-15: 162 mg via ORAL
  Filled 2016-06-14: qty 2

## 2016-06-14 MED ORDER — TIOTROPIUM BROMIDE MONOHYDRATE 18 MCG IN CAPS
18.0000 ug | ORAL_CAPSULE | Freq: Every day | RESPIRATORY_TRACT | Status: DC
  Administered 2016-06-14 – 2016-06-15 (×2): 18 ug via RESPIRATORY_TRACT
  Filled 2016-06-14: qty 5

## 2016-06-14 MED ORDER — PROTAMINE SULFATE 10 MG/ML IV SOLN
INTRAVENOUS | Status: DC | PRN
  Administered 2016-06-14: 15 mg via INTRAVENOUS
  Administered 2016-06-14: 10 mg via INTRAVENOUS

## 2016-06-14 MED ORDER — ROCURONIUM BROMIDE 100 MG/10ML IV SOLN
INTRAVENOUS | Status: DC | PRN
  Administered 2016-06-14: 50 mg via INTRAVENOUS

## 2016-06-14 MED ORDER — HYPROMELLOSE (GONIOSCOPIC) 2.5 % OP SOLN
1.0000 [drp] | Freq: Every day | OPHTHALMIC | Status: DC | PRN
  Filled 2016-06-14: qty 15

## 2016-06-14 MED ORDER — DEXTROSE 5 % IV SOLN
1.5000 g | INTRAVENOUS | Status: AC
  Administered 2016-06-14: 1.5 g via INTRAVENOUS
  Filled 2016-06-14: qty 1.5

## 2016-06-14 MED ORDER — PROPOFOL 10 MG/ML IV BOLUS
INTRAVENOUS | Status: AC
  Filled 2016-06-14: qty 20

## 2016-06-14 MED ORDER — HEPARIN SODIUM (PORCINE) 1000 UNIT/ML IJ SOLN
INTRAMUSCULAR | Status: AC
  Filled 2016-06-14: qty 1

## 2016-06-14 MED ORDER — IODIXANOL 320 MG/ML IV SOLN
INTRAVENOUS | Status: DC | PRN
  Administered 2016-06-14: 100 mL
  Administered 2016-06-14: 150 mL

## 2016-06-14 MED ORDER — GABAPENTIN 300 MG PO CAPS
300.0000 mg | ORAL_CAPSULE | Freq: Every day | ORAL | Status: DC
  Administered 2016-06-14: 300 mg via ORAL
  Filled 2016-06-14: qty 1

## 2016-06-14 MED ORDER — ALBUTEROL SULFATE (2.5 MG/3ML) 0.083% IN NEBU: 2.5000 mg | INHALATION_SOLUTION | Freq: Four times a day (QID) | RESPIRATORY_TRACT | Status: DC | PRN

## 2016-06-14 MED ORDER — MORPHINE SULFATE (PF) 4 MG/ML IV SOLN: 2.0000 mg | INTRAVENOUS | Status: DC | PRN

## 2016-06-14 MED ORDER — HYDRALAZINE HCL 20 MG/ML IJ SOLN: 5.0000 mg | INTRAMUSCULAR | Status: DC | PRN

## 2016-06-14 MED ORDER — PROPOFOL 10 MG/ML IV BOLUS
INTRAVENOUS | Status: DC | PRN
  Administered 2016-06-14: 80 mg via INTRAVENOUS

## 2016-06-14 MED ORDER — OXYCODONE-ACETAMINOPHEN 5-325 MG PO TABS
1.0000 | ORAL_TABLET | ORAL | Status: DC | PRN
  Administered 2016-06-14: 2 via ORAL
  Filled 2016-06-14: qty 2

## 2016-06-14 MED ORDER — GUAIFENESIN-DM 100-10 MG/5ML PO SYRP: 15.0000 mL | ORAL_SOLUTION | ORAL | Status: DC | PRN

## 2016-06-14 MED ORDER — ALUM & MAG HYDROXIDE-SIMETH 200-200-20 MG/5ML PO SUSP: 15.0000 mL | ORAL | Status: DC | PRN

## 2016-06-14 MED ORDER — DEXTROSE 5 % IV SOLN
1.5000 g | Freq: Two times a day (BID) | INTRAVENOUS | Status: AC
  Administered 2016-06-14 – 2016-06-15 (×2): 1.5 g via INTRAVENOUS
  Filled 2016-06-14 (×2): qty 1.5

## 2016-06-14 MED ORDER — MOMETASONE FURO-FORMOTEROL FUM 200-5 MCG/ACT IN AERO
2.0000 | INHALATION_SPRAY | Freq: Two times a day (BID) | RESPIRATORY_TRACT | Status: DC
  Administered 2016-06-15: 2 via RESPIRATORY_TRACT
  Filled 2016-06-14 (×2): qty 8.8

## 2016-06-14 MED ORDER — LACTATED RINGERS IV SOLN
INTRAVENOUS | Status: DC | PRN
  Administered 2016-06-14 (×2): via INTRAVENOUS

## 2016-06-14 MED ORDER — MIDAZOLAM HCL 2 MG/2ML IJ SOLN
INTRAMUSCULAR | Status: DC | PRN
  Administered 2016-06-14: 1 mg via INTRAVENOUS

## 2016-06-14 MED ORDER — FENTANYL CITRATE (PF) 100 MCG/2ML IJ SOLN: 25.0000 ug | INTRAMUSCULAR | Status: DC | PRN

## 2016-06-14 MED ORDER — COLESEVELAM HCL 625 MG PO TABS
625.0000 mg | ORAL_TABLET | Freq: Two times a day (BID) | ORAL | Status: DC
  Administered 2016-06-14 – 2016-06-15 (×3): 625 mg via ORAL
  Filled 2016-06-14 (×3): qty 1

## 2016-06-14 MED ORDER — POLYETHYL GLYCOL-PROPYL GLYCOL 0.4-0.3 % OP GEL
Freq: Every day | OPHTHALMIC | Status: DC | PRN
  Filled 2016-06-14: qty 10

## 2016-06-14 MED ORDER — POTASSIUM CHLORIDE CRYS ER 20 MEQ PO TBCR: 20.0000 meq | EXTENDED_RELEASE_TABLET | Freq: Every day | ORAL | Status: DC | PRN

## 2016-06-14 MED ORDER — ONDANSETRON HCL 4 MG/2ML IJ SOLN
INTRAMUSCULAR | Status: DC | PRN
  Administered 2016-06-14: 4 mg via INTRAVENOUS

## 2016-06-14 MED ORDER — PHENYLEPHRINE HCL 10 MG/ML IJ SOLN
INTRAMUSCULAR | Status: DC | PRN
  Administered 2016-06-14: 20 ug/min via INTRAVENOUS

## 2016-06-14 MED ORDER — LIDOCAINE HCL (CARDIAC) 20 MG/ML IV SOLN
INTRAVENOUS | Status: DC | PRN
  Administered 2016-06-14: 100 mg via INTRAVENOUS

## 2016-06-14 MED ORDER — HEPARIN SODIUM (PORCINE) 1000 UNIT/ML IJ SOLN
INTRAMUSCULAR | Status: DC | PRN
  Administered 2016-06-14: 6000 [IU] via INTRAVENOUS

## 2016-06-14 MED ORDER — ROCURONIUM BROMIDE 10 MG/ML (PF) SYRINGE
PREFILLED_SYRINGE | INTRAVENOUS | Status: AC
  Filled 2016-06-14: qty 5

## 2016-06-14 MED ORDER — LOSARTAN POTASSIUM 50 MG PO TABS
50.0000 mg | ORAL_TABLET | Freq: Every day | ORAL | Status: DC
Start: 2016-06-15 — End: 2016-06-15
  Administered 2016-06-15: 50 mg via ORAL
  Filled 2016-06-14: qty 1

## 2016-06-14 MED ORDER — ALBUTEROL SULFATE HFA 108 (90 BASE) MCG/ACT IN AERS: 2.0000 | INHALATION_SPRAY | Freq: Four times a day (QID) | RESPIRATORY_TRACT | Status: DC | PRN

## 2016-06-14 MED ORDER — ENOXAPARIN SODIUM 40 MG/0.4ML ~~LOC~~ SOLN
40.0000 mg | SUBCUTANEOUS | Status: DC
  Administered 2016-06-15: 40 mg via SUBCUTANEOUS
  Filled 2016-06-14: qty 0.4

## 2016-06-14 MED ORDER — ACETAMINOPHEN 650 MG RE SUPP: 325.0000 mg | RECTAL | Status: DC | PRN

## 2016-06-14 MED ORDER — LIDOCAINE 2% (20 MG/ML) 5 ML SYRINGE
INTRAMUSCULAR | Status: AC
  Filled 2016-06-14: qty 5

## 2016-06-14 MED ORDER — ONDANSETRON HCL 4 MG/2ML IJ SOLN: 4.0000 mg | Freq: Four times a day (QID) | INTRAMUSCULAR | Status: DC | PRN

## 2016-06-14 MED ORDER — METOPROLOL TARTRATE 5 MG/5ML IV SOLN: 2.0000 mg | INTRAVENOUS | Status: DC | PRN

## 2016-06-14 MED ORDER — FENTANYL CITRATE (PF) 100 MCG/2ML IJ SOLN
INTRAMUSCULAR | Status: DC | PRN
  Administered 2016-06-14 (×3): 50 ug via INTRAVENOUS

## 2016-06-14 MED ORDER — PANTOPRAZOLE SODIUM 40 MG PO TBEC
40.0000 mg | DELAYED_RELEASE_TABLET | Freq: Every day | ORAL | Status: DC
  Administered 2016-06-14 – 2016-06-15 (×2): 40 mg via ORAL
  Filled 2016-06-14 (×2): qty 1

## 2016-06-14 MED ORDER — SUGAMMADEX SODIUM 200 MG/2ML IV SOLN
INTRAVENOUS | Status: DC | PRN
  Administered 2016-06-14: 120 mg via INTRAVENOUS

## 2016-06-14 MED ORDER — ACETAMINOPHEN 325 MG PO TABS: 325.0000 mg | ORAL_TABLET | ORAL | Status: DC | PRN

## 2016-06-14 MED ORDER — FENTANYL CITRATE (PF) 250 MCG/5ML IJ SOLN
INTRAMUSCULAR | Status: AC
  Filled 2016-06-14: qty 5

## 2016-06-14 MED ORDER — LABETALOL HCL 5 MG/ML IV SOLN: 10.0000 mg | INTRAVENOUS | Status: DC | PRN

## 2016-06-14 MED ORDER — MAGNESIUM SULFATE 2 GM/50ML IV SOLN: 2.0000 g | Freq: Every day | INTRAVENOUS | Status: DC | PRN

## 2016-06-14 MED ORDER — MIDAZOLAM HCL 2 MG/2ML IJ SOLN
INTRAMUSCULAR | Status: AC
  Filled 2016-06-14: qty 2

## 2016-06-14 MED ORDER — PHENOL 1.4 % MT LIQD: 1.0000 | OROMUCOSAL | Status: DC | PRN

## 2016-06-14 MED ORDER — SODIUM CHLORIDE 0.9 % IV SOLN: 500.0000 mL | Freq: Once | INTRAVENOUS | Status: DC | PRN

## 2016-06-14 MED ORDER — KCL IN DEXTROSE-NACL 20-5-0.45 MEQ/L-%-% IV SOLN
INTRAVENOUS | Status: DC
Start: 2016-06-14 — End: 2016-06-15
  Administered 2016-06-14 – 2016-06-15 (×2): via INTRAVENOUS
  Filled 2016-06-14 (×4): qty 1000

## 2016-06-14 MED ORDER — LORAZEPAM 0.5 MG PO TABS: 0.5000 mg | ORAL_TABLET | Freq: Three times a day (TID) | ORAL | Status: DC | PRN

## 2016-06-14 MED ORDER — IPRATROPIUM BROMIDE 0.02 % IN SOLN: 0.5000 mg | Freq: Four times a day (QID) | RESPIRATORY_TRACT | Status: DC | PRN

## 2016-06-14 MED ORDER — PANTOPRAZOLE SODIUM 40 MG PO TBEC: 40.0000 mg | DELAYED_RELEASE_TABLET | Freq: Every day | ORAL | Status: DC

## 2016-06-14 MED ORDER — ONDANSETRON HCL 4 MG/2ML IJ SOLN
INTRAMUSCULAR | Status: AC
  Filled 2016-06-14: qty 2

## 2016-06-14 MED ORDER — ROSUVASTATIN CALCIUM 10 MG PO TABS
5.0000 mg | ORAL_TABLET | Freq: Every day | ORAL | Status: DC
  Administered 2016-06-14: 5 mg via ORAL
  Filled 2016-06-14: qty 1

## 2016-06-14 SURGICAL SUPPLY — 56 items
ADH SKN CLS APL DERMABOND .7 (GAUZE/BANDAGES/DRESSINGS) ×1
BAG SNAP BAND KOVER 36X36 (MISCELLANEOUS) ×2 IMPLANT
BLADE CLIPPER SURG (BLADE) ×3 IMPLANT
CANISTER SUCT 3000ML PPV (MISCELLANEOUS) ×3 IMPLANT
CATH ACCU-VU SIZ PIG 5F 70CM (CATHETERS) IMPLANT
CATH ANGIO 5F BER2 65CM (CATHETERS) ×2 IMPLANT
CATH BEACON 5.038 65CM KMP-01 (CATHETERS) IMPLANT
CATH OMNI FLUSH .035X70CM (CATHETERS) ×2 IMPLANT
COVER PROBE W GEL 5X96 (DRAPES) IMPLANT
DERMABOND ADVANCED (GAUZE/BANDAGES/DRESSINGS) ×2
DERMABOND ADVANCED .7 DNX12 (GAUZE/BANDAGES/DRESSINGS) ×1 IMPLANT
DEVICE CLOSURE PERCLS PRGLD 6F (VASCULAR PRODUCTS) IMPLANT
DEVICE TORQUE H2O (MISCELLANEOUS) ×2 IMPLANT
DRSG TEGADERM 2-3/8X2-3/4 SM (GAUZE/BANDAGES/DRESSINGS) ×6 IMPLANT
DRYSEAL FLEXSHEATH 12FR 33CM (SHEATH) ×2
DRYSEAL FLEXSHEATH 18FR 33CM (SHEATH) ×2
ELECT CAUTERY BLADE 6.4 (BLADE) ×2 IMPLANT
ELECT REM PT RETURN 9FT ADLT (ELECTROSURGICAL) ×6
ELECTRODE REM PT RTRN 9FT ADLT (ELECTROSURGICAL) ×2 IMPLANT
EXCLUDER TNK LEG 28MX12X16 (Endovascular Graft) IMPLANT
EXCLUDER TRUNK LEG 28MX12X16 (Endovascular Graft) ×3 IMPLANT
EXTENDER ENDOPROSTHESIS 10X7 (Endovascular Graft) ×2 IMPLANT
GAUZE SPONGE 2X2 8PLY STRL LF (GAUZE/BANDAGES/DRESSINGS) ×2 IMPLANT
GLOVE BIO SURGEON STRL SZ7.5 (GLOVE) ×3 IMPLANT
GLOVE BIOGEL PI IND STRL 8 (GLOVE) ×1 IMPLANT
GLOVE BIOGEL PI INDICATOR 8 (GLOVE) ×2
GOWN STRL REUS W/ TWL LRG LVL3 (GOWN DISPOSABLE) ×3 IMPLANT
GOWN STRL REUS W/TWL LRG LVL3 (GOWN DISPOSABLE) ×9
GRAFT BALLN CATH 65CM (STENTS) IMPLANT
GUIDEWIRE ANGLED .035X150CM (WIRE) ×2 IMPLANT
KIT BASIN OR (CUSTOM PROCEDURE TRAY) ×3 IMPLANT
KIT ROOM TURNOVER OR (KITS) ×3 IMPLANT
LEG CONTRALATERAL 16X14.5X10 (Vascular Products) ×2 IMPLANT
NDL PERC 18GX7CM (NEEDLE) ×1 IMPLANT
NEEDLE PERC 18GX7CM (NEEDLE) ×3 IMPLANT
NS IRRIG 1000ML POUR BTL (IV SOLUTION) ×3 IMPLANT
PACK ENDOVASCULAR (PACKS) ×3 IMPLANT
PAD ARMBOARD 7.5X6 YLW CONV (MISCELLANEOUS) ×6 IMPLANT
PERCLOSE PROGLIDE 6F (VASCULAR PRODUCTS) ×12
SHEATH AVANTI 11CM 8FR (MISCELLANEOUS) ×2 IMPLANT
SHEATH BRITE TIP 8FR 23CM (MISCELLANEOUS) ×2 IMPLANT
SHEATH DRYSEAL FLEX 12FR 33CM (SHEATH) IMPLANT
SHEATH DRYSEAL FLEX 18FR 33CM (SHEATH) IMPLANT
SLEEVE SURGEON STRL (DRAPES) ×2 IMPLANT
SPONGE GAUZE 2X2 STER 10/PKG (GAUZE/BANDAGES/DRESSINGS) ×4
STAPLER VISISTAT (STAPLE) IMPLANT
STENT GRAFT BALLN CATH 65CM (STENTS) ×2
STOPCOCK MORSE 400PSI 3WAY (MISCELLANEOUS) ×5 IMPLANT
SUT PROLENE 5 0 C 1 24 (SUTURE) IMPLANT
SUT VICRYL 4-0 PS2 18IN ABS (SUTURE) ×6 IMPLANT
SYR 30ML LL (SYRINGE) ×3 IMPLANT
SYR MEDRAD MARK V 150ML (SYRINGE) ×2 IMPLANT
TRAY FOLEY W/METER SILVER 16FR (SET/KITS/TRAYS/PACK) ×3 IMPLANT
TUBING HIGH PRESSURE 120CM (CONNECTOR) ×5 IMPLANT
WIRE AMPLATZ SS-J .035X180CM (WIRE) ×4 IMPLANT
WIRE BENTSON .035X145CM (WIRE) ×4 IMPLANT

## 2016-06-14 NOTE — Telephone Encounter (Signed)
Sched appts 07/20/16; CTA 1:30 at [place] and MD at 3:00. Lm on hm# for pt to confirm appts.

## 2016-06-14 NOTE — Progress Notes (Signed)
   VASCULAR SURGERY POSTOP CHECK:   Doing well postop.   Anticipate discharge in a.m.  SUBJECTIVE:   Wants Foley catheter out.  PHYSICAL EXAM:   Vitals:   06/14/16 1050 06/14/16 1110 06/14/16 1200 06/14/16 1300  BP: 125/66 124/73 130/81 (!) 146/71  Pulse: 72 72 72 79  Resp: 15 20 16  (!) 23  Temp:      TempSrc:      SpO2: 93% 95% 96% 95%  Weight:      Height:       Palpable dorsalis pedis and posterior tibial pulses. Groin sites look fine.  LABS:   Lab Results  Component Value Date   WBC 12.3 (H) 06/14/2016   HGB 13.4 06/14/2016   HCT 39.9 06/14/2016   MCV 87.1 06/14/2016   PLT 242 06/14/2016   Lab Results  Component Value Date   CREATININE 0.80 06/14/2016   Lab Results  Component Value Date   INR 1.15 06/14/2016   CBG (last 3)  No results for input(s): GLUCAP in the last 72 hours.  PROBLEM LIST:    Active Problems:   History of abdominal aortic aneurysm (AAA)   CURRENT MEDS:   . amLODipine  5 mg Oral QPM  . [START ON 06/15/2016] aspirin EC  162 mg Oral Daily  . colesevelam  625 mg Oral BID  . [START ON 06/15/2016] docusate sodium  100 mg Oral Daily  . [START ON 06/15/2016] enoxaparin (LOVENOX) injection  40 mg Subcutaneous Q24H  . gabapentin  300 mg Oral QHS  . [START ON 06/15/2016] losartan  50 mg Oral Daily  . mometasone-formoterol  2 puff Inhalation BID  . pantoprazole  40 mg Oral Daily  . rosuvastatin  5 mg Oral Q supper  . tiotropium  18 mcg Inhalation Daily    Gae Gallop Beeper: [phone] Office: [phone] 06/14/2016

## 2016-06-14 NOTE — Anesthesia Postprocedure Evaluation (Signed)
Anesthesia Post Note  Patient: Jared Guerrero  Procedure(s) Performed: Procedure(s) (LRB): ABDOMINAL AORTIC ENDOVASCULAR STENT GRAFT (N/A)  Patient location during evaluation: PACU Anesthesia Type: General Level of consciousness: awake and alert Pain management: pain level controlled Vital Signs Assessment: post-procedure vital signs reviewed and stable Respiratory status: spontaneous breathing, nonlabored ventilation, respiratory function stable and patient connected to nasal cannula oxygen Cardiovascular status: blood pressure returned to baseline and stable Postop Assessment: no signs of nausea or vomiting Anesthetic complications: no       Last Vitals:  Vitals:   06/14/16 1025 06/14/16 1030  BP: (!) 108/59   Pulse: 74 71  Resp: 17 17  Temp: 36.5 C     Last Pain:  Vitals:   06/14/16 1025  TempSrc:   PainSc: 0-No pain                 Anissia Wessells,W. EDMOND

## 2016-06-14 NOTE — Telephone Encounter (Signed)
-----   Message from Mena Goes, RN sent at 06/14/2016 10:15 AM EDT ----- Regarding: CTA and postop in 1 month   ----- Message ----- From: Angelia Mould, MD Sent: 06/14/2016   9:38 AM To: [facility] Charge Pool Subject: charge                                          PROCEDURE:   Percutaneous endovascular repair of right common iliac artery aneurysm   CO-SURGEON's: Judeth Cornfield. Scot Dock, MD, Adele Barthel, MD  He will need a follow up CT scan in 1 month with an office visit at that time. That would be a CT angiogram of the abdomen and pelvis please thank you. CD

## 2016-06-14 NOTE — Op Note (Addendum)
OPERATIVE NOTE   PROCEDURE: 1. left common femoral artery cannulation under ultrasound guidance 2. "Preclose" repair of left common femoral artery, i.e. proglide placement x 2 3. Placement of contralateral iliac limb (Gore 14 mm x 10 cm)  PRE-OPERATIVE DIAGNOSIS: large iliac artery aneurysm   POST-OPERATIVE DIAGNOSIS: same as above   CO-SURGEONS:   Gae Gallop, MD, Adele Barthel, MD  ANESTHESIA: general  ESTIMATED BLOOD LOSS: see Dr. Nicole Cella note  FINDING(S): 1.  Successful exclusion of aneurysm at end of case  2.  Endoleaks: small Type 2 3.  Bilateral patent renal arteries and patent left internal iliac artery and coiled right  SPECIMEN(S):  none  INDICATIONS:   Jared Guerrero is a 71 y.o. (08-Jul-1945) male who presents with large iliac artery aneurysm.  Dr. Scot Dock recommended endovascular aortic repair after prior right internal iliac artery embolization.  The patient is aware the risks of endovascular aortic surgery include but are not limited to: bleeding, need for transfusion, infection, death, stroke, paralysis, wound complications, spinal, pelvic and bowel ischemia, extended ventilation, anaphylactic reaction to contrast, contrast induced nephropathy, embolism, and need for additional procedure to address endoleaks.  Overall, a mortality rate of 1-2% and morbidity rate of 15% was cited.  The patient is aware of the risks and agree to proceed.   DESCRIPTION: After obtaining full informed written consent, the patient was brought back to the operating room and placed supine upon the operating table.  The patient received IV antibiotics prior to induction.  A procedure time out was completed and the correct surgical site was verified.  After obtaining adequate anesthesia, the patient was prepped and draped in the standard fashion for: open or endovascular aortic procedure.  A two surgeon technique was utilized to maintain constant control of the main body of  the endograft and to expedite completion of this case.  Ipsilateral Access See Dr. Nicole Cella note for details of ipsilateral access.  Contralateral Access I turned my attention to the left groin  Under ultrasound guidance,  the common femoral artery was then cannulated with a 18 gauge needle.  The Bentson wire was passed up into the aorta.  The needle was exchanged for a 8-Fr dilator, which was used to dilate the subcutaneous tract and arterial puncture.  The dilator was exchanged for a Proglide device.  This was deployed at 30 degrees medial rotation.  The Proglide sutures were tagged and then the Bentson wire was reloaded in the used Proglide device.  The used device was exchanged for a new Proglide device.  The new device was deployed at 30 degrees lateral rotation.  The new Proglide sutures were tagged and then the Bentson wire was reloaded in the Proglide device.  The device was exchanged for a long 8-Fr sheath.  In this fashion, the "Preclose" technique repair of the common femoral artery was started.    Heparinization and Placement of Main Body of Endograft and Initial Aortogram See Dr. Nicole Cella note for details on anticoagulation and placement of main body of endograft.  I steered a Bentson wire into the suprarenal aorta with the assistance of a BER-2 catheter.  The catheter was exchanged for a marker pigtail catheter.  The catheter was placed in the suprarenal segment.  The wire was removed and the catheter was connected to the power injector circuit.  The catheter was de-airred and de-cloted.  A power injector aortogram was completed.  Dr. Scot Dock deployed the main body of the endograft in the infrarenal position (see  his note for details.)  Contralateral Gate Cannnulation At this point, I replaced a Bentson wire in the pigtail catheter and then placed a BER-2 catheter over the wire.  I pulled the catheter down into the distal aorta.  Eventually using a BER-2 and a Bentson wire, I was able to  cannulate the contralateral gate.  I advanced the wire and catheter into the main body without difficulty.  The wire was exchanged for the Amplatz wire.  I exchanged the catheter for a pigtail catheter.  I spun the formed catheter at the top of the main body, demonstrating intragraft location.  I replaced the Amplatz wire in the descending thoracic aorta and then pulled the marker pigtail catheter down to the flow divider.    Contralateral Limb Placement I then did a right anterior oblique pelvic injection to image the takeoff of the contralateral internal iliac artery.  Based on the measurements, I selected a 14 mm x 10 cm iliac limb.  The contralateral sheath was exchanged for a 12-Fr sheath.  I loaded the sheath into the main body.  The iliac limb was advanced into the main body device.  I pulled back the contralateral sheath.   This was deployed with adequate overlap.  The stent delivery device was removed.  Ipsilateral Limb Extension At this point, Dr. Scot Dock fully deployed the ipsilateral limb and removed the main body stent delivery device.  He completed an ipsilateral retrograde injection to demonstrate the position of the right internal iliac artery.  Based on the images, an ipsilateral limb extension into the right external iliac artery was needed, previously planned. See Dr. Nicole Cella note for detail.  Aortic and Limb Molding The Q50 molding balloon was loaded on the contralateral wire and advanced into the main body.  I molded the proximal graft, overlapping segments just distal to the flow divider and the distal extent of the graft in the contralateral iliac artery.  The balloon was deflated and placed on the ipsilateral wire.  Dr. Scot Dock inflated the balloon at the overlapping segment in the ipsilateral limb and in the distal extent of the ipsilateral iliac artery.  The balloon was deflated and removed.  Completion Aortogram The pigtail catheter was replaced on the contralateral wire  and advanced into the suprarenal aorta.  The wire was removed and the catheter was connected to the power injector circuit.  A power injector aortogram was completed: a small proximal Type 2 endoleak was evident with intact bilateral renal artery flow and intact left internal iliac artery flow.  The right internal iliac artery remained occluded with the coils in place.  I replaced the Bentson wire into the catheter, straightening out the crook in the catheter.  I removed the catheter.  I then placed a BER-2 catheter over the right wire and then exchanged the wire for a Bentson wire.    Completion of Pre-close Repair of Common Femoral Arteries At this point, Dr. Scot Dock gave Protamine to reverse anticoagulation (see his notes for details).  I turned my attention to the contralateral groin.  Dr. Scot Dock held pressure proximally and distally on this common femoral artery as I removed the femoral sheath.  There was no change in blood pressure with this maneuver.  I then sequentially tightened the previously tagged Proglide sutures.  There was minimal bleeding with the wire still in place, so I removed the wire.  I sequentially tightened the previous tagged Proglide sutures again and locked each set of sutures by applying tension to the  locking stitch in each pair.  In this fashion, I repaired the contralateral common femoral artery.  This exact same process was completed in the ipsilateral groin, after removing the sheath on that side.  Hemostats were applied to the sutures in both groin.    After waiting a few minutes, I transected the sutures in left groin.  Dr. Scot Dock similarly repeated this same process.  The skin incisions in both groin were repaired with U-stitches of 4-0 Vicryl.  The skin was cleaned, dried, and Dermabond used to reinforce the skin closures.  A sterile dressing was applied to both groins.   Adele Barthel, MD, FACS [facility] [place] Office: [phone] Pager:  [phone]  06/14/2016, 9:27 AM

## 2016-06-14 NOTE — Anesthesia Procedure Notes (Signed)
Procedure Name: Intubation Date/Time: 06/14/2016 7:44 AM Performed by: Barrington Ellison Pre-anesthesia Checklist: Patient identified, Emergency Drugs available, Suction available and Patient being monitored Patient Re-evaluated:Patient Re-evaluated prior to inductionOxygen Delivery Method: Circle System Utilized Preoxygenation: Pre-oxygenation with 100% oxygen Intubation Type: IV induction Ventilation: Mask ventilation without difficulty Laryngoscope Size: Mac and 3 Grade View: Grade I Tube type: Oral Tube size: 7.5 mm Number of attempts: 1 Airway Equipment and Method: Stylet Placement Confirmation: ETT inserted through vocal cords under direct vision,  positive ETCO2 and breath sounds checked- equal and bilateral Secured at: 22 cm Tube secured with: Tape Dental Injury: Teeth and Oropharynx as per pre-operative assessment

## 2016-06-14 NOTE — Op Note (Signed)
NAME: BARRIE WALE    MRN: [id number] DOB: 10-19-1945    DATE OF OPERATION: 06/14/2016  PREOP DIAGNOSIS:    Right common iliac artery aneurysm  POSTOP DIAGNOSIS:    Same  PROCEDURE:    Percutaneous endovascular repair of right common iliac artery aneurysm   CO-SURGEON's: Judeth Cornfield. Scot Dock, MD, Adele Barthel, MD  ANESTHESIA: Gen.   EBL: 100 cc  INDICATIONS:    COREYON NICOTRA is a 71 y.o. male who I had been following with a right common iliac artery aneurysm. This enlarged to 4.6 cm and given the risk of rupture, elective repair was recommended. He underwent preoperative coil embolization of the right internal iliac artery and presents for staged repair of the aneurysm.  FINDINGS:    Completion arteriogram showed an excellent result with the graft in good position and no evidence of endoleak. The aneurysm was successfully excluded.  TECHNIQUE:    The patient was taken to the operating room and received a general anesthetic. Monitoring lines had been placed by anesthesia. The abdomen and groins were prepped and draped in the usual sterile fashion. On the right side, under ultrasound guidance, the right common femoral artery was cannulated and a guidewire introduced under fluoroscopic control. The artery was then preclosed with 2 Perclose devices. The initial Perclose device was rotated 15 medially. The second Perclose device was rotated 15 laterally. A short 8 French sheath was then placed over the wire. I then exchanged the [facility] wire for an Amplatz wire using the Omni Flush catheter. I checked the distal tip to be sure that this would not advanced to far and the wire was then marked on the back table.  Similarly, the left common femoral artery was Perclosed with 2 Perclose devices as dictated by Dr. Bridgett Larsson. A long 8 French sheath was placed on the left side. The Omni Flush catheter was placed up the left side. I then exchanged the short 8 French sheath on the right  for the 18 French sheath after the patient was heparinized. This advanced without difficulty despite the small size of the artery with significant calcific disease. Next the trunk ipsilateral component, which was a 28 mm x 12 mm x 16 cm device was advanced through the 18 sheath from the right side to the level of the lowest renal artery which was the left renal artery. The gait was rotated such that the contralateral limb would be crossed.  Next, the contralateral gate was cannulated and the contralateral limb placed as dictated separately by Dr. Adele Barthel. The contralateral limb was a 14 mm x 10 cm limb.  Next I finished deploying the trunk ipsilateral component. In order to extend down to the external iliac artery on the right a 10 mm x 7 cm device was selected and overlapped into the ipsilateral limb and deployed down into the external iliac artery without difficulty. Next the proximal graft, overlap sections, and distal grafts were all ballooned. A completion film was obtained which showed an excellent result with the graft in excellent position, exclusion of the right common iliac artery aneurysm, and no evidence of endoleak.  Next on the left side sheath was removed and the Perclose device is secured with good hemostasis. Likewise, on the right side the sheath was removed, and the Perclose devices were secured with good hemostasis. The sutures were then cut and the each of the incisions in the groin closed with a 4-0 Vicryl. Sterile dressing was applied. The patient tolerated  the procedure well and was transferred to the recovery room in stable condition. All needle and sponge counts were correct.  [name], MD, FACS [facility]  DATE OF DICTATION:   06/14/2016

## 2016-06-14 NOTE — H&P (Signed)
Patient name: Jared Guerrero       MRN: [id number]        DOB: 19-May-1945        Sex: male  REASON FOR VISIT: Right common iliac artery aneurysm.  HPI: Jared Guerrero is a 71 y.o. 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 When I last saw him on 09/21/2015, by duplex the maximum diameter of his infrarenal aorta was 3 cm. The maximum diameter of his right common iliac artery aneurysm was 4 cm. We discussed the importance of tobacco cessation and I set him up for a CT angiogram of the abdomen and pelvis in 6 months.  Since I saw him last, he denies any history of abdominal pain or back pain. There have been no changes in his medical history.  He does have a history of COPD and smokes 1 pack per day. He has been smoking for over 50 years. He is followed by Dr. Melvyn Novas.  He also had a recent colonoscopy and had a polyp partially removed and may need further work on this.  He denies any significant claudication or rest pain.      Past Medical History:  Diagnosis Date  . AAA (abdominal aortic aneurysm) ([place])   . Adenomatous polyp   . Bronchitis   . CHF (congestive heart failure) ([place])   . COPD (chronic obstructive pulmonary disease) ([place])   . Diabetes mellitus   . Hyperlipidemia   . Hypertension   . Productive cough           Family History  Problem Relation Age of Onset  . Heart disease Mother 29  . Hyperlipidemia Mother   . Heart attack Mother   . Hypertension Mother   . Heart attack Father 23  . Heart disease Father     before age 74  . Hyperlipidemia Father   . Emphysema Father     was a smoker  . Hypertension Father   . Aneurysm Maternal Grandfather   . Thyroid cancer Brother   . Colon cancer Neg Hx   . Esophageal cancer Neg Hx   . Stomach cancer Neg Hx     SOCIAL HISTORY:      Social History  Substance Use Topics  . Smoking status: Current Every Day Smoker    Packs/day: 0.50     Years: 35.00    Types: Cigarettes  . Smokeless tobacco: Never Used  . Alcohol use No    No Known Allergies        Current Outpatient Prescriptions  Medication Sig Dispense Refill  . albuterol (PROVENTIL) (2.5 MG/3ML) 0.083% nebulizer solution     . amLODipine (NORVASC) 10 MG tablet Take 10 mg by mouth daily.    Marland Kitchen aspirin EC 81 MG tablet Take 81 mg by mouth daily.    . budesonide-formoterol (SYMBICORT) 160-4.5 MCG/ACT inhaler Inhale 2 puffs into the lungs 2 (two) times daily.    . colesevelam (WELCHOL) 625 MG tablet Take 1,875 mg by mouth daily.     Marland Kitchen gabapentin (NEURONTIN) 300 MG capsule Take 1 capsule by mouth at bedtime.    Marland Kitchen ipratropium (ATROVENT) 0.02 % nebulizer solution     . LORazepam (ATIVAN) 1 MG tablet Take 1 mg by mouth daily as needed for anxiety.     Marland Kitchen losartan (COZAAR) 50 MG tablet Take 50 mg by mouth daily.    . pantoprazole (PROTONIX) 40 MG tablet     .  polyethylene glycol-electrolytes (TRILYTE) 420 g solution Take 4,000 mLs by mouth as directed. 4000 mL 0  . rosuvastatin (CRESTOR) 5 MG tablet     . tiotropium (SPIRIVA) 18 MCG inhalation capsule Place 18 mcg into inhaler and inhale daily.     No current facility-administered medications for this visit.     REVIEW OF SYSTEMS:  [X]  denotes positive finding, [ ]  denotes negative finding Cardiac  Comments:  Chest pain or chest pressure:    Shortness of breath upon exertion: X   Short of breath when lying flat:    Irregular heart rhythm:        Vascular    Pain in calf, thigh, or hip brought on by ambulation:    Pain in feet at night that wakes you up from your sleep:     Blood clot in your veins:    Leg swelling:         Pulmonary    Oxygen at home:    Productive cough:     Wheezing:         Neurologic    Sudden weakness in arms or legs:     Sudden numbness in arms or legs:     Sudden onset of difficulty speaking or slurred  speech:    Temporary loss of vision in one eye:     Problems with dizziness:         Gastrointestinal    Blood in stool:     Vomited blood:         Genitourinary    Burning when urinating:     Blood in urine:        Psychiatric    Major depression:         Hematologic    Bleeding problems:    Problems with blood clotting too easily:        Skin    Rashes or ulcers:        Constitutional    Fever or chills:      PHYSICAL EXAM:    Vitals:   03/28/16 1201  BP: 132/72  Pulse: 98  Resp: (!) 24  Temp: 97.2 F (36.2 C)  TempSrc: Oral  SpO2: 92%  Weight: 142 lb (64.4 kg)  Height: 5\' 8"  (1.727 m)    GENERAL: The patient is a well-nourished male, in no acute distress. The vital signs are documented above. CARDIAC: There is a regular rate and rhythm.  VASCULAR: I do not detect carotid bruits. He has palpable femoral pulses and palpable pedal pulses bilaterally. He has no significant lower extremity swelling. PULMONARY: There is good air exchange bilaterally without wheezing or rales. ABDOMEN: Soft and non-tender with normal pitched bowel sounds.  MUSCULOSKELETAL: There are no major deformities or cyanosis. NEUROLOGIC: No focal weakness or paresthesias are detected. SKIN: There are no ulcers or rashes noted. PSYCHIATRIC: The patient has a normal affect.  DATA:   CT ANGIO ABDOMEN PELVIS:  My interpretation of the films showed that the maximum diameter of the right common iliac artery aneurysm is 4.6 cm. He does not have significant aneurysmal disease of the infrarenal aorta all the way slightly dilated to 3 cm.  MEDICAL ISSUES:  4.6 CM ASYMPTOMATIC RIGHT COMMON ILIAC ARTERY ANEURYSM: Given the continued enlargement of his right common iliac artery aneurysm I have recommended elective repair. He has significant COPD and I think that ideally we should be able to do an endovascular approach. I have laready  coiled his right internal iliac artery.  He presents now for staged endovascular aneurysm repair. We have discussed the procedure with potential complications and he is agreeable to proceed. He has undergone preoperative Cardiac and Pulmonary evaluation.     [name]

## 2016-06-14 NOTE — Transfer of Care (Signed)
Immediate Anesthesia Transfer of Care Note  Patient: Jared Guerrero  Procedure(s) Performed: Procedure(s): ABDOMINAL AORTIC ENDOVASCULAR STENT GRAFT (N/A)  Patient Location: PACU  Anesthesia Type:General  Level of Consciousness: drowsy, patient cooperative and responds to stimulation  Airway & Oxygen Therapy: Patient Spontanous Breathing  Post-op Assessment: Report given to RN  Post vital signs: Reviewed and stable  Last Vitals:  Vitals:   06/14/16 0555  BP: (!) 147/66  Pulse: 91  Resp: 20  Temp: 36.5 C    Last Pain:  Vitals:   06/14/16 0555  TempSrc: Oral      Patients Stated Pain Goal: 5 (93/[id number]/42 8768)  Complications: No apparent anesthesia complications

## 2016-06-15 ENCOUNTER — Encounter (HOSPITAL_COMMUNITY): Payer: Self-pay | Admitting: Vascular Surgery

## 2016-06-15 LAB — CBC
HEMATOCRIT: 41.8 % (ref 39.0–52.0)
Hemoglobin: 14.2 g/dL (ref 13.0–17.0)
MCH: 29.4 pg (ref 26.0–34.0)
MCHC: 34 g/dL (ref 30.0–36.0)
MCV: 86.5 fL (ref 78.0–100.0)
Platelets: 221 10*3/uL (ref 150–400)
RBC: 4.83 MIL/uL (ref 4.22–5.81)
RDW: 16 % — AB (ref 11.5–15.5)
WBC: 13.3 10*3/uL — AB (ref 4.0–10.5)

## 2016-06-15 LAB — BASIC METABOLIC PANEL
ANION GAP: 7 (ref 5–15)
BUN: 7 mg/dL (ref 6–20)
CO2: 23 mmol/L (ref 22–32)
Calcium: 8.8 mg/dL — ABNORMAL LOW (ref 8.9–10.3)
Chloride: 107 mmol/L (ref 101–111)
Creatinine, Ser: 0.84 mg/dL (ref 0.61–1.24)
GFR calc Af Amer: >60 mL/min (ref >60)
GFR calc non Af Amer: >60 mL/min (ref >60)
GLUCOSE: 114 mg/dL — AB (ref 65–99)
POTASSIUM: 3.9 mmol/L (ref 3.5–5.1)
Sodium: 137 mmol/L (ref 135–145)

## 2016-06-15 MED ORDER — OXYCODONE-ACETAMINOPHEN 5-325 MG PO TABS: 1.0000 | ORAL_TABLET | Freq: Four times a day (QID) | ORAL | 0 refills | Status: DC | PRN

## 2016-06-15 NOTE — Progress Notes (Signed)
06/15/16  1140  Reviewed discharge instructions with patient. Patient verbalized understanding of discharge instructions. Copy of discharge instructions and prescriptions given to patient.

## 2016-06-15 NOTE — Progress Notes (Signed)
  Progress Note    06/15/2016 7:31 AM 1 Day Post-Op  Subjective:  No complaints; has not ambulated.  Voiding okay.  Afebrile HR 70's-80's NSR 599'J-570'V systolic 77% RA  Vitals:   06/15/16 0326 06/15/16 0400  BP: 118/63 121/60  Pulse: 73 68  Resp: 18 12  Temp: 97.9 F (36.6 C)     Physical Exam: Cardiac:  regular Lungs:  Non labored Incisions:  Bilateral groins are soft without hematoma Extremities:  + palpable DP pulses bilaterally Abdomen:  Soft, NT/ND  CBC    Component Value Date/Time   WBC 13.3 (H) 06/15/2016 0426   RBC 4.83 06/15/2016 0426   HGB 14.2 06/15/2016 0426   HCT 41.8 06/15/2016 0426   PLT 221 06/15/2016 0426   MCV 86.5 06/15/2016 0426   MCH 29.4 06/15/2016 0426   MCHC 34.0 06/15/2016 0426   RDW 16.0 (H) 06/15/2016 0426   LYMPHSABS 2.7 12/19/2011 0924   MONOABS 0.8 12/19/2011 0924   EOSABS 0.5 12/19/2011 0924   BASOSABS 0.1 12/19/2011 0924    BMET    Component Value Date/Time   NA 137 06/15/2016 0426   K 3.9 06/15/2016 0426   CL 107 06/15/2016 0426   CO2 23 06/15/2016 0426   GLUCOSE 114 (H) 06/15/2016 0426   BUN 7 06/15/2016 0426   CREATININE 0.84 06/15/2016 0426   CREATININE 1.12 01/24/2011 1348   CALCIUM 8.8 (L) 06/15/2016 0426   GFRNONAA >60 06/15/2016 0426   GFRAA >60 06/15/2016 0426    INR    Component Value Date/Time   INR 1.15 06/14/2016 0930     Intake/Output Summary (Last 24 hours) at 06/15/16 0731 Last data filed at 06/15/16 0647  Gross per 24 hour  Intake             3720 ml  Output             3425 ml  Net              295 ml     Assessment:  71 y.o. male is s/p:  Percutaneous endovascular repair of right common iliac artery aneurysm  1 Day Post-Op  Plan: -pt doing well this morning  -pt has not ambulated-needs to walk -will discharge home later after breakfast -f.u with Dr. Scot Dock in 4 weeks.   Leontine Locket, PA-C [facility] [phone] 06/15/2016 7:31 AM

## 2016-06-15 NOTE — Discharge Summary (Signed)
[facility] Discharge Summary   Jared Guerrero [birth year] 71 y.o. male  MRN: [id number]  Admission Date: 06/14/2016  Discharge Date: 06/15/16  Physician: Angelia Mould, *  Admission Diagnosis: Right iliac artery aneurysm I72.3; Infrarenal abdominal aortic aneurysm I71.4   HPI:   This is a 71 y.o. 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 When I last saw him on 09/21/2015, by duplex the maximum diameter of his infrarenal aorta was 3 cm. The maximum diameter of his right common iliac artery aneurysm was 4 cm. We discussed the importance of tobacco cessation and I set him up for a CT angiogram of the abdomen and pelvis in 6 months.  Since I saw him last, he denies any history of abdominal pain or back pain. There have been no changes in his medical history.  He does have a history of COPD and smokes 1 pack per day. He has been smoking for over 50 years. He is followed by Dr. Melvyn Novas.  He also had a recent colonoscopy and had a polyp partially removed and may need further work on this.  He denies any significant claudication or rest pain.  Hospital Course:  The patient was admitted to the hospital and taken to the operating room on 06/14/2016 and underwent: Percutaneous endovascular repair of right common iliac artery aneurysm    The pt tolerated the procedure well and was transported to the PACU in good condition.   By POD 1, the pt was doing well.  He had palpable pedal pulses.  He was voiding without difficulty.    The remainder of the hospital course consisted of increasing mobilization and increasing intake of solids without difficulty.  CBC    Component Value Date/Time   WBC 13.3 (H) 06/15/2016 0426   RBC 4.83 06/15/2016 0426   HGB 14.2 06/15/2016 0426   HCT 41.8 06/15/2016 0426   PLT 221 06/15/2016 0426   MCV 86.5 06/15/2016 0426   MCH 29.4 06/15/2016 0426   MCHC 34.0 06/15/2016 0426   RDW 16.0 (H) 06/15/2016 0426   LYMPHSABS 2.7 12/19/2011 0924   MONOABS 0.8 12/19/2011 0924   EOSABS 0.5 12/19/2011 0924   BASOSABS 0.1 12/19/2011 0924    BMET    Component Value Date/Time   NA 137 06/15/2016 0426   K 3.9 06/15/2016 0426   CL 107 06/15/2016 0426   CO2 23 06/15/2016 0426   GLUCOSE 114 (H) 06/15/2016 0426   BUN 7 06/15/2016 0426   CREATININE 0.84 06/15/2016 0426   CREATININE 1.12 01/24/2011 1348   CALCIUM 8.8 (L) 06/15/2016 0426   GFRNONAA >60 06/15/2016 0426   GFRAA >60 06/15/2016 0426       Discharge Instructions    ABDOMINAL PROCEDURE/ANEURYSM REPAIR/AORTO-BIFEMORAL BYPASS:  Call MD for increased abdominal pain; cramping diarrhea; nausea/vomiting    Complete by:  As directed    Call MD for:  redness, tenderness, or signs of infection (pain, swelling, bleeding, redness, odor or green/yellow discharge around incision site)    Complete by:  As directed    Call MD for:  severe or increased pain, loss or decreased feeling  in affected limb(s)    Complete by:  As directed    Call MD for:  temperature >100.5    Complete by:  As directed    Discharge wound care:    Complete by:  As directed    Shower daily with soap and water starting 06/16/16   Driving Restrictions  Complete by:  As directed    No driving for 2 weeks   Lifting restrictions    Complete by:  As directed    No lifting for 4 weeks   Resume previous diet    Complete by:  As directed       Discharge Diagnosis:  Right iliac artery aneurysm I72.3; Infrarenal abdominal aortic aneurysm I71.4  Secondary Diagnosis: Patient Active Problem List   Diagnosis Date Noted  . History of abdominal aortic aneurysm (AAA) 06/14/2016  . Preoperative clearance 05/01/2016  . Abnormal CT of the chest 10/10/2015  . History of colonic polyps 10/10/2015  . Iliac artery aneurysm ([place]) 08/05/2013  . Cigarette smoker 12/26/2011  . Essential hypertension 12/20/2011  . Tremor 12/19/2011  . COPD GOLD III still smoking 12/19/2011   Past  Medical History:  Diagnosis Date  . AAA (abdominal aortic aneurysm) ([place])   . Adenomatous polyp   . Anxiety    takes Ativan as needed  . Asthma    Symbicort inhaler and Atrovent neb as needed.Spiriva daily  . Bronchitis   . COPD (chronic obstructive pulmonary disease) ([facility])    Albuerol inhaler as needed.Albuterol neb as needed  . GERD (gastroesophageal reflux disease)    takes Protonix daily  . Hyperlipidemia    takes Welchol and Crestor  daily  . Hypertension    takes Losartan and Amlodipine daily  . Productive cough      Allergies as of 06/15/2016      Reactions   No Known Allergies       Medication List    TAKE these medications   albuterol 108 (90 Base) MCG/ACT inhaler Commonly known as:  PROVENTIL HFA;VENTOLIN HFA Inhale 2 puffs into the lungs every 6 (six) hours as needed for wheezing or shortness of breath.   albuterol (2.5 MG/3ML) 0.083% nebulizer solution Commonly known as:  PROVENTIL Take 2.5 mg by nebulization every 6 (six) hours as needed for wheezing or shortness of breath.   amLODipine 5 MG tablet Commonly known as:  NORVASC Take 5 mg by mouth every evening.   aspirin EC 81 MG tablet Take 162 mg by mouth daily.   budesonide-formoterol 160-4.5 MCG/ACT inhaler Commonly known as:  SYMBICORT Inhale 2 puffs into the lungs 2 (two) times daily.   colesevelam 625 MG tablet Commonly known as:  WELCHOL Take 625 mg by mouth 2 (two) times daily.   gabapentin 300 MG capsule Commonly known as:  NEURONTIN Take 300 mg by mouth at bedtime.   ipratropium 0.02 % nebulizer solution Commonly known as:  ATROVENT Take 0.5 mg by nebulization every 6 (six) hours as needed for wheezing or shortness of breath.   LORazepam 1 MG tablet Commonly known as:  ATIVAN Take 0.5 mg by mouth 3 (three) times daily as needed for anxiety.   losartan 50 MG tablet Commonly known as:  COZAAR Take 50 mg by mouth daily.   oxyCODONE-acetaminophen 5-325 MG tablet Commonly known as:   PERCOCET/ROXICET Take 1 tablet by mouth every 6 (six) hours as needed for moderate pain.   pantoprazole 40 MG tablet Commonly known as:  PROTONIX Take 40 mg by mouth daily before supper.   rosuvastatin 5 MG tablet Commonly known as:  CRESTOR Take 5 mg by mouth daily with supper.   SYSTANE OP Apply 1 drop to eye daily as needed (dry eyes).   tiotropium 18 MCG inhalation capsule Commonly known as:  SPIRIVA Place 18 mcg into inhaler and inhale daily.  Prescriptions given: Roxicet #10 No Refill  Instructions: 1.  No heavy lifting x 4 weeks 2.  No driving x 2 weeks and while taking pain medication 3.  Shower daily starting 06/16/16  Disposition: home  Patient's condition: is Good  Follow up: 1. Dr. Scot Dock in 4 weeks with CTA protocol   Leontine Locket, PA-C [facility] [phone] 06/15/2016  7:40 AM   - For VQI Registry use - Post-op:  Time to Extubation: [x]  In OR, [ ]  < 12 hrs, [ ]  12-24 hrs, [ ]  >=24 hrs Vasopressors Req. Post-op: No MI: No., [ ]  Troponin only, [ ]  EKG or Clinical New Arrhythmia: No CHF: No ICU Stay: 1 day in stepdonw Transfusion: No     If yes, n/a units given  Complications: Resp failure: No., [ ]  Pneumonia, [ ]  Ventilator Chg in renal function: No., [ ]  Inc. Cr > 0.5, [ ]  Temp. Dialysis,  [ ]  Permanent dialysis Leg ischemia: No., no Surgery needed, [ ]  Yes, Surgery needed,  [ ]  Amputation Bowel ischemia: No., [ ]  Medical Rx, [ ]  Surgical Rx Wound complication: No., [ ]  Superficial separation/infection, [ ]  Return to OR Return to OR: No  Return to OR for bleeding: No Stroke: No., [ ]  Minor, [ ]  Major  Discharge medications: Statin use:  Yes If No:  ASA use:  Yes  If No:  Plavix use:  No  Beta blocker use:  No  ARB use:  Yes ACEI use:  No CCB use:  Yes

## 2016-06-25 DIAGNOSIS — J449 Chronic obstructive pulmonary disease, unspecified: Secondary | ICD-10-CM | POA: Diagnosis not present

## 2016-06-28 ENCOUNTER — Ambulatory Visit: Payer: Medicare Other | Admitting: Internal Medicine

## 2016-07-06 ENCOUNTER — Encounter: Payer: Self-pay | Admitting: Vascular Surgery

## 2016-07-14 DIAGNOSIS — J449 Chronic obstructive pulmonary disease, unspecified: Secondary | ICD-10-CM | POA: Diagnosis not present

## 2016-07-17 ENCOUNTER — Telehealth: Payer: Self-pay | Admitting: Vascular Surgery

## 2016-07-17 NOTE — Telephone Encounter (Signed)
Spoke to pt to resch md appt 08/02/16 at 12:45.

## 2016-07-17 NOTE — Telephone Encounter (Signed)
-----   Message from Mena Goes, RN sent at 07/17/2016 12:17 PM EDT ----- Regarding: This should be on Dickson's schedule for postop, Please correct asap   ----- Message ----- From: Conrad Walker Mill, MD Sent: 07/17/2016   2:12 AM To: [address]  HAWKINS SEAMAN [phone] 1945/03/19  This is Dr. Nicole Cella patient so I'm not certain why he's following up with me.

## 2016-07-20 ENCOUNTER — Encounter: Payer: Medicare Other | Admitting: Vascular Surgery

## 2016-07-20 ENCOUNTER — Ambulatory Visit
Admit: 2016-07-20 | Discharge: 2016-07-20 | Disposition: A | Discharge status: Home / Self Care | Payer: Medicare Other | Attending: Vascular Surgery | Admitting: Vascular Surgery

## 2016-07-20 DIAGNOSIS — I714 Abdominal aortic aneurysm, without rupture, unspecified: Secondary | ICD-10-CM

## 2016-07-20 DIAGNOSIS — I723 Aneurysm of iliac artery: Secondary | ICD-10-CM

## 2016-07-20 DIAGNOSIS — Z95828 Presence of other vascular implants and grafts: Secondary | ICD-10-CM

## 2016-07-20 MED ORDER — IOPAMIDOL (ISOVUE-370) INJECTION 76%
75.0000 mL | Freq: Once | INTRAVENOUS | Status: AC | PRN
  Administered 2016-07-20: 75 mL via INTRAVENOUS

## 2016-07-23 ENCOUNTER — Encounter: Payer: Self-pay | Admitting: Vascular Surgery

## 2016-07-24 DIAGNOSIS — J449 Chronic obstructive pulmonary disease, unspecified: Secondary | ICD-10-CM | POA: Diagnosis not present

## 2016-07-31 ENCOUNTER — Ambulatory Visit: Payer: Medicare Other | Admitting: Internal Medicine

## 2016-08-02 ENCOUNTER — Encounter: Payer: Self-pay | Admitting: Vascular Surgery

## 2016-08-02 ENCOUNTER — Ambulatory Visit (INDEPENDENT_AMBULATORY_CARE_PROVIDER_SITE_OTHER): Payer: Self-pay | Admitting: Vascular Surgery

## 2016-08-02 VITALS — BP 141/82 | HR 105

## 2016-08-02 DIAGNOSIS — I714 Abdominal aortic aneurysm, without rupture, unspecified: Secondary | ICD-10-CM

## 2016-08-02 DIAGNOSIS — Z48812 Encounter for surgical aftercare following surgery on the circulatory system: Secondary | ICD-10-CM

## 2016-08-02 NOTE — Progress Notes (Signed)
Patient name: Jared Guerrero MRN: [id number] DOB: 11-Dec-1945 Sex: male  REASON FOR VISIT:    Follow up after endovascular aneurysm repair.  HPI:   Jared Guerrero is a pleasant 71 y.o. male who had been following with a right common iliac artery aneurysm. This enlarged to 4.8 cm. Given the risk of rupture I recommended elective repair. He underwent preoperative coil embolization of the right internal iliac artery and presented for a staged endovascular repair of his aneurysm on 06/14/2016. He comes in for a one-month follow up visit.  Today, he has no specific complaints. He denies abdominal pain or back pain. He denies claudication.  Current Outpatient Prescriptions  Medication Sig Dispense Refill  . albuterol (PROVENTIL HFA;VENTOLIN HFA) 108 (90 Base) MCG/ACT inhaler Inhale 2 puffs into the lungs every 6 (six) hours as needed for wheezing or shortness of breath.    Marland Kitchen albuterol (PROVENTIL) (2.5 MG/3ML) 0.083% nebulizer solution Take 2.5 mg by nebulization every 6 (six) hours as needed for wheezing or shortness of breath.     Marland Kitchen amLODipine (NORVASC) 5 MG tablet Take 5 mg by mouth every evening.    Marland Kitchen aspirin EC 81 MG tablet Take 162 mg by mouth daily.     . budesonide-formoterol (SYMBICORT) 160-4.5 MCG/ACT inhaler Inhale 2 puffs into the lungs 2 (two) times daily.    . colesevelam (WELCHOL) 625 MG tablet Take 625 mg by mouth 2 (two) times daily.     Marland Kitchen gabapentin (NEURONTIN) 300 MG capsule Take 300 mg by mouth at bedtime.     Marland Kitchen ipratropium (ATROVENT) 0.02 % nebulizer solution Take 0.5 mg by nebulization every 6 (six) hours as needed for wheezing or shortness of breath.     Marland Kitchen LORazepam (ATIVAN) 1 MG tablet Take 0.5 mg by mouth 3 (three) times daily as needed for anxiety.     Marland Kitchen losartan (COZAAR) 50 MG tablet Take 50 mg by mouth daily.    Marland Kitchen oxyCODONE-acetaminophen (PERCOCET/ROXICET) 5-325 MG tablet Take 1 tablet by mouth every 6 (six) hours as needed for moderate pain. 10 tablet 0  .  pantoprazole (PROTONIX) 40 MG tablet Take 40 mg by mouth daily before supper.     Vladimir Faster Glycol-Propyl Glycol (SYSTANE OP) Apply 1 drop to eye daily as needed (dry eyes).    . rosuvastatin (CRESTOR) 5 MG tablet Take 5 mg by mouth daily with supper.     . tiotropium (SPIRIVA) 18 MCG inhalation capsule Place 18 mcg into inhaler and inhale daily.     No current facility-administered medications for this visit.     REVIEW OF SYSTEMS:  [X]  denotes positive finding, [ ]  denotes negative finding Cardiac  Comments:  Chest pain or chest pressure:    Shortness of breath upon exertion:    Short of breath when lying flat:    Irregular heart rhythm:    Constitutional    Fever or chills:     PHYSICAL EXAM:   Vitals:   08/02/16 1225 08/02/16 1227  BP: (!) 146/85 (!) 141/82  Pulse: (!) 105 (!) 105    GENERAL: The patient is a well-nourished male, in no acute distress. The vital signs are documented above. CARDIOVASCULAR: There is a regular rate and rhythm. PULMONARY: There is good air exchange bilaterally without wheezing or rales. His groin sites look fine. Abdomen is soft and nontender. He has palpable pedal pulses.  DATA:   CT SCAN ABDOMEN PELVIS: I have reviewed her CT scan of the abdomen and pelvis  that was done on 07/11/1916. This shows that his stent graft is in good position with affective S occlusion of the right common iliac artery aneurysm which measures 4.8 cm maximum diameter. There is a small type II endoleak. The infrarenal aorta measures 3.2 cm in maximum diameter.  MEDICAL ISSUES:   STATUS POST ENDOVASCULAR REPAIR OF 4.8 CM RIGHT COMMON ILIAC ARTERY ANEURYSM: The patient is doing well status post endovascular repair of his aneurysm. He has a small type II endoleak. I ordered a follow up ultrasound in 6 months and see him back at that time. He is on aspirin and is on a statin. He knows to call sooner if he has problems.      : [name] [facility] [phone]

## 2016-08-06 NOTE — Addendum Note (Signed)
Addended by: Lianne Cure A on: 08/06/2016 10:45 AM   Modules accepted: Orders

## 2016-08-13 DIAGNOSIS — J449 Chronic obstructive pulmonary disease, unspecified: Secondary | ICD-10-CM | POA: Diagnosis not present

## 2016-08-25 DIAGNOSIS — J449 Chronic obstructive pulmonary disease, unspecified: Secondary | ICD-10-CM | POA: Diagnosis not present

## 2016-09-26 DIAGNOSIS — J449 Chronic obstructive pulmonary disease, unspecified: Secondary | ICD-10-CM | POA: Diagnosis not present

## 2016-10-23 DIAGNOSIS — Z23 Encounter for immunization: Secondary | ICD-10-CM | POA: Diagnosis not present

## 2016-10-24 DIAGNOSIS — J449 Chronic obstructive pulmonary disease, unspecified: Secondary | ICD-10-CM | POA: Diagnosis not present

## 2016-10-25 DIAGNOSIS — Z1389 Encounter for screening for other disorder: Secondary | ICD-10-CM | POA: Diagnosis not present

## 2016-10-25 DIAGNOSIS — J449 Chronic obstructive pulmonary disease, unspecified: Secondary | ICD-10-CM | POA: Diagnosis not present

## 2016-10-25 DIAGNOSIS — J209 Acute bronchitis, unspecified: Secondary | ICD-10-CM | POA: Diagnosis not present

## 2016-10-25 DIAGNOSIS — J069 Acute upper respiratory infection, unspecified: Secondary | ICD-10-CM | POA: Diagnosis not present

## 2016-11-27 DIAGNOSIS — J449 Chronic obstructive pulmonary disease, unspecified: Secondary | ICD-10-CM | POA: Diagnosis not present

## 2016-12-25 DIAGNOSIS — J449 Chronic obstructive pulmonary disease, unspecified: Secondary | ICD-10-CM | POA: Diagnosis not present

## 2016-12-25 DIAGNOSIS — J069 Acute upper respiratory infection, unspecified: Secondary | ICD-10-CM | POA: Diagnosis not present

## 2016-12-25 DIAGNOSIS — R7309 Other abnormal glucose: Secondary | ICD-10-CM | POA: Diagnosis not present

## 2016-12-25 DIAGNOSIS — E782 Mixed hyperlipidemia: Secondary | ICD-10-CM | POA: Diagnosis not present

## 2016-12-25 DIAGNOSIS — Z1389 Encounter for screening for other disorder: Secondary | ICD-10-CM | POA: Diagnosis not present

## 2017-01-21 DIAGNOSIS — J449 Chronic obstructive pulmonary disease, unspecified: Secondary | ICD-10-CM | POA: Diagnosis not present

## 2017-02-04 DIAGNOSIS — J449 Chronic obstructive pulmonary disease, unspecified: Secondary | ICD-10-CM | POA: Diagnosis not present

## 2017-02-13 ENCOUNTER — Encounter: Payer: Self-pay | Admitting: Vascular Surgery

## 2017-02-13 ENCOUNTER — Ambulatory Visit (INDEPENDENT_AMBULATORY_CARE_PROVIDER_SITE_OTHER): Payer: Medicare Other | Admitting: Vascular Surgery

## 2017-02-13 ENCOUNTER — Ambulatory Visit (HOSPITAL_COMMUNITY)
Admission: RE | Admit: 2017-02-13 | Discharge: 2017-02-13 | Disposition: A | Discharge status: Home / Self Care | Payer: Medicare Other | Source: Ambulatory Visit | Attending: Vascular Surgery | Admitting: Vascular Surgery

## 2017-02-13 VITALS — BP 123/79 | HR 96 | Temp 97.0°F | Resp 16 | Ht 68.0 in | Wt 130.0 lb

## 2017-02-13 DIAGNOSIS — I714 Abdominal aortic aneurysm, without rupture, unspecified: Secondary | ICD-10-CM

## 2017-02-13 DIAGNOSIS — Z48812 Encounter for surgical aftercare following surgery on the circulatory system: Secondary | ICD-10-CM | POA: Insufficient documentation

## 2017-02-13 DIAGNOSIS — I723 Aneurysm of iliac artery: Secondary | ICD-10-CM

## 2017-02-13 NOTE — Progress Notes (Signed)
Patient name: Jared Guerrero MRN: [id number] DOB: 1945/10/22 Sex: male  REASON FOR VISIT:   Follow-up after endovascular aneurysm repair.  HPI:   Jared Guerrero is a pleasant 72 y.o. male who I last saw on 08/02/2016.  I have been following him with a right common iliac artery aneurysm which had enlarged to 4.8 cm.  He underwent coil embolization of the right internal iliac artery and then presented for staged endovascular repair of his aneurysm which was done on 06/14/2016.  His 1 month follow-up CT scan showed that the graft was in good position with the aneurysm measuring 4.8 cm in maximum diameter.  There was a small type II endoleak.  The infrarenal aorta measured 3.2 cm in maximum diameter.  Since I saw him last, he denies any abdominal pain or back pain.  There have been no change in his medical history.  He does continue to smoke.  Past Medical History:  Diagnosis Date  . AAA (abdominal aortic aneurysm) ([place])   . Adenomatous polyp   . Anxiety    takes Ativan as needed  . Asthma    Symbicort inhaler and Atrovent neb as needed.Spiriva daily  . Bronchitis   . COPD (chronic obstructive pulmonary disease) ([facility])    Albuerol inhaler as needed.Albuterol neb as needed  . GERD (gastroesophageal reflux disease)    takes Protonix daily  . Hyperlipidemia    takes Welchol and Crestor  daily  . Hypertension    takes Losartan and Amlodipine daily  . Productive cough     Family History  Problem Relation Age of Onset  . Heart disease Mother 60  . Hyperlipidemia Mother   . Heart attack Mother   . Hypertension Mother   . Heart attack Father 79  . Heart disease Father        before age 18  . Hyperlipidemia Father   . Emphysema Father        was a smoker  . Hypertension Father   . Aneurysm Maternal Grandfather   . Thyroid cancer Brother   . Colon cancer Neg Hx   . Esophageal cancer Neg Hx   . Stomach cancer Neg Hx     SOCIAL HISTORY: Social History   Tobacco Use  .  Smoking status: Current Every Day Smoker    Packs/day: 1.00    Years: 45.00    Pack years: 45.00    Types: Cigarettes  . Smokeless tobacco: Never Used  Substance Use Topics  . Alcohol use: No    Alcohol/week: 0.0 oz    Allergies  Allergen Reactions  . No Known Allergies     Current Outpatient Medications  Medication Sig Dispense Refill  . albuterol (PROVENTIL HFA;VENTOLIN HFA) 108 (90 Base) MCG/ACT inhaler Inhale 2 puffs into the lungs every 6 (six) hours as needed for wheezing or shortness of breath.    Marland Kitchen albuterol (PROVENTIL) (2.5 MG/3ML) 0.083% nebulizer solution Take 2.5 mg by nebulization every 6 (six) hours as needed for wheezing or shortness of breath.     Marland Kitchen amLODipine (NORVASC) 5 MG tablet Take 5 mg by mouth every evening.    Marland Kitchen aspirin EC 81 MG tablet Take 162 mg by mouth daily.     . budesonide-formoterol (SYMBICORT) 160-4.5 MCG/ACT inhaler Inhale 2 puffs into the lungs 2 (two) times daily.    . colesevelam (WELCHOL) 625 MG tablet Take 625 mg by mouth 2 (two) times daily.     Marland Kitchen gabapentin (NEURONTIN) 300 MG capsule Take  300 mg by mouth at bedtime.     Marland Kitchen ipratropium (ATROVENT) 0.02 % nebulizer solution Take 0.5 mg by nebulization every 6 (six) hours as needed for wheezing or shortness of breath.     Marland Kitchen LORazepam (ATIVAN) 1 MG tablet Take 0.5 mg by mouth 3 (three) times daily as needed for anxiety.     Marland Kitchen losartan (COZAAR) 50 MG tablet Take 50 mg by mouth daily.    . pantoprazole (PROTONIX) 40 MG tablet Take 40 mg by mouth daily before supper.     Vladimir Faster Glycol-Propyl Glycol (SYSTANE OP) Apply 1 drop to eye daily as needed (dry eyes).    . rosuvastatin (CRESTOR) 5 MG tablet Take 5 mg by mouth daily with supper.     . tiotropium (SPIRIVA) 18 MCG inhalation capsule Place 18 mcg into inhaler and inhale daily.     No current facility-administered medications for this visit.     REVIEW OF SYSTEMS:  [X]  denotes positive finding, [ ]  denotes negative finding Cardiac   Comments:  Chest pain or chest pressure:    Shortness of breath upon exertion:    Short of breath when lying flat:    Irregular heart rhythm:        Vascular    Pain in calf, thigh, or hip brought on by ambulation:    Pain in feet at night that wakes you up from your sleep:     Blood clot in your veins:    Leg swelling:         Pulmonary    Oxygen at home:    Productive cough:     Wheezing:         Neurologic    Sudden weakness in arms or legs:     Sudden numbness in arms or legs:     Sudden onset of difficulty speaking or slurred speech:    Temporary loss of vision in one eye:     Problems with dizziness:         Gastrointestinal    Blood in stool:     Vomited blood:         Genitourinary    Burning when urinating:     Blood in urine:        Psychiatric    Major depression:         Hematologic    Bleeding problems:    Problems with blood clotting too easily:        Skin    Rashes or ulcers:        Constitutional    Fever or chills:     PHYSICAL EXAM:   Vitals:   02/13/17 0950  BP: 123/79  Pulse: 96  Resp: 16  Temp: (!) 97 F (36.1 C)  TempSrc: Oral  SpO2: 96%  Weight: 130 lb (59 kg)  Height: 5\' 8"  (1.727 m)    GENERAL: The patient is a well-nourished male, in no acute distress. The vital signs are documented above. CARDIAC: There is a regular rate and rhythm.  VASCULAR: I do not detect carotid bruits. He has palpable femoral and dorsalis pedis pulses bilaterally. PULMONARY: There is good air exchange bilaterally without wheezing or rales. ABDOMEN: Soft and non-tender with normal pitched bowel sounds.  MUSCULOSKELETAL: There are no major deformities or cyanosis. NEUROLOGIC: No focal weakness or paresthesias are detected. SKIN: There are no ulcers or rashes noted. PSYCHIATRIC: The patient has a normal affect.  DATA:    DUPLEX ABDOMINAL AORTA: I have  independently interpreted his duplex today which shows that the maximum diameter of his right  common iliac artery is 3.4 cm.  MEDICAL ISSUES:   STATUS POST ENDOVASCULAR REPAIR OF 4.8 CM RIGHT COMMON ILIAC ARTERY ANEURYSM: The patient is doing well status post endovascular repair of his 4.8 cm right common iliac artery aneurysm.  The aneurysm has decreased in size to 3.4 cm.  This is certainly encouraging.  This reason I think it safe to stretch his follow-up out to 1 year.  I have ordered a duplex in 1 year and I will see him back at that time.  We have again discussed the importance of tobacco cessation.  He knows to call sooner if he has problems.  [name] [facility] [phone]

## 2017-02-25 DIAGNOSIS — J449 Chronic obstructive pulmonary disease, unspecified: Secondary | ICD-10-CM | POA: Diagnosis not present

## 2017-02-28 DIAGNOSIS — J439 Emphysema, unspecified: Secondary | ICD-10-CM | POA: Diagnosis not present

## 2017-02-28 DIAGNOSIS — Z1389 Encounter for screening for other disorder: Secondary | ICD-10-CM | POA: Diagnosis not present

## 2017-02-28 DIAGNOSIS — Z86010 Personal history of colonic polyps: Secondary | ICD-10-CM | POA: Diagnosis not present

## 2017-02-28 DIAGNOSIS — I723 Aneurysm of iliac artery: Secondary | ICD-10-CM | POA: Diagnosis not present

## 2017-02-28 DIAGNOSIS — Z6820 Body mass index (BMI) 20.0-20.9, adult: Secondary | ICD-10-CM | POA: Diagnosis not present

## 2017-02-28 DIAGNOSIS — Z0001 Encounter for general adult medical examination with abnormal findings: Secondary | ICD-10-CM | POA: Diagnosis not present

## 2017-03-07 DIAGNOSIS — J449 Chronic obstructive pulmonary disease, unspecified: Secondary | ICD-10-CM | POA: Diagnosis not present

## 2017-03-25 DIAGNOSIS — J449 Chronic obstructive pulmonary disease, unspecified: Secondary | ICD-10-CM | POA: Diagnosis not present

## 2017-04-24 DIAGNOSIS — J449 Chronic obstructive pulmonary disease, unspecified: Secondary | ICD-10-CM | POA: Diagnosis not present

## 2017-05-15 DIAGNOSIS — Z1389 Encounter for screening for other disorder: Secondary | ICD-10-CM | POA: Diagnosis not present

## 2017-05-15 DIAGNOSIS — H6692 Otitis media, unspecified, left ear: Secondary | ICD-10-CM | POA: Diagnosis not present

## 2017-05-24 DIAGNOSIS — J449 Chronic obstructive pulmonary disease, unspecified: Secondary | ICD-10-CM | POA: Diagnosis not present

## 2017-06-25 DIAGNOSIS — J449 Chronic obstructive pulmonary disease, unspecified: Secondary | ICD-10-CM | POA: Diagnosis not present

## 2017-07-27 DIAGNOSIS — J449 Chronic obstructive pulmonary disease, unspecified: Secondary | ICD-10-CM | POA: Diagnosis not present

## 2017-08-25 DIAGNOSIS — J449 Chronic obstructive pulmonary disease, unspecified: Secondary | ICD-10-CM | POA: Diagnosis not present

## 2017-09-24 DIAGNOSIS — J449 Chronic obstructive pulmonary disease, unspecified: Secondary | ICD-10-CM | POA: Diagnosis not present

## 2017-09-25 DIAGNOSIS — Z23 Encounter for immunization: Secondary | ICD-10-CM | POA: Diagnosis not present

## 2017-10-30 DIAGNOSIS — J449 Chronic obstructive pulmonary disease, unspecified: Secondary | ICD-10-CM | POA: Diagnosis not present

## 2017-11-27 DIAGNOSIS — J449 Chronic obstructive pulmonary disease, unspecified: Secondary | ICD-10-CM | POA: Diagnosis not present

## 2017-12-25 DIAGNOSIS — J449 Chronic obstructive pulmonary disease, unspecified: Secondary | ICD-10-CM | POA: Diagnosis not present

## 2018-01-24 DIAGNOSIS — J441 Chronic obstructive pulmonary disease with (acute) exacerbation: Secondary | ICD-10-CM | POA: Diagnosis not present

## 2018-01-24 DIAGNOSIS — Z1389 Encounter for screening for other disorder: Secondary | ICD-10-CM | POA: Diagnosis not present

## 2018-01-24 DIAGNOSIS — J449 Chronic obstructive pulmonary disease, unspecified: Secondary | ICD-10-CM | POA: Diagnosis not present

## 2018-02-21 DIAGNOSIS — E7849 Other hyperlipidemia: Secondary | ICD-10-CM | POA: Diagnosis not present

## 2018-02-21 DIAGNOSIS — Z0001 Encounter for general adult medical examination with abnormal findings: Secondary | ICD-10-CM | POA: Diagnosis not present

## 2018-02-21 DIAGNOSIS — Z1389 Encounter for screening for other disorder: Secondary | ICD-10-CM | POA: Diagnosis not present

## 2018-02-21 DIAGNOSIS — R319 Hematuria, unspecified: Secondary | ICD-10-CM | POA: Diagnosis not present

## 2018-02-21 DIAGNOSIS — I10 Essential (primary) hypertension: Secondary | ICD-10-CM | POA: Diagnosis not present

## 2018-02-21 DIAGNOSIS — Z719 Counseling, unspecified: Secondary | ICD-10-CM | POA: Diagnosis not present

## 2018-02-26 ENCOUNTER — Encounter: Payer: Self-pay | Admitting: Vascular Surgery

## 2018-02-26 ENCOUNTER — Other Ambulatory Visit (HOSPITAL_COMMUNITY): Payer: Self-pay | Admitting: Vascular Surgery

## 2018-02-26 ENCOUNTER — Other Ambulatory Visit: Payer: Self-pay

## 2018-02-26 ENCOUNTER — Ambulatory Visit (HOSPITAL_COMMUNITY)
Admission: RE | Admit: 2018-02-26 | Discharge: 2018-02-26 | Disposition: A | Discharge status: Home / Self Care | Payer: Medicare Other | Source: Ambulatory Visit | Attending: Family | Admitting: Family

## 2018-02-26 ENCOUNTER — Ambulatory Visit: Payer: Medicare Other | Admitting: Vascular Surgery

## 2018-02-26 VITALS — BP 137/82 | HR 109 | Temp 96.7°F | Resp 16 | Ht 69.0 in | Wt 130.0 lb

## 2018-02-26 DIAGNOSIS — I714 Abdominal aortic aneurysm, without rupture, unspecified: Secondary | ICD-10-CM

## 2018-02-26 DIAGNOSIS — J449 Chronic obstructive pulmonary disease, unspecified: Secondary | ICD-10-CM | POA: Diagnosis not present

## 2018-02-26 DIAGNOSIS — Z09 Encounter for follow-up examination after completed treatment for conditions other than malignant neoplasm: Secondary | ICD-10-CM | POA: Insufficient documentation

## 2018-02-26 DIAGNOSIS — Z48812 Encounter for surgical aftercare following surgery on the circulatory system: Secondary | ICD-10-CM | POA: Diagnosis not present

## 2018-02-26 NOTE — Progress Notes (Signed)
Patient name: Jared Guerrero MRN: [id number] DOB: 1945-02-06 Sex: male  REASON FOR VISIT:   Follow-up after endovascular aneurysm repair.  HPI:   Jared Guerrero is a pleasant 73 y.o. male who I last saw on 02/13/2017.  He underwent coil embolization of his right internal iliac artery and presented for staged endovascular repair of his 4.8 cm right common iliac artery aneurysm.  He underwent endovascular repair on 06/14/2016.  The infrarenal met aorta measured 3.2 cm in maximum diameter.  I last saw him on 02/13/2017.  At that time the maximum diameter of his right common iliac artery aneurysm had decreased to 3.4 cm in maximum diameter.  I felt it was safe to stretch his follow-up out to 1 year and he comes in for a yearly follow-up visit.  Of note we also discussed the importance of tobacco cessation.  Since I saw him last he denies any abdominal pain or back pain.  There have been no significant changes in his medical history.  He does continue to smoke.  He is under a fair amount of stress as his wife has cancer.  He denies any claudication, rest pain, or nonhealing ulcers.  He is on aspirin and is on a statin.  Past Medical History:  Diagnosis Date  . AAA (abdominal aortic aneurysm) ([place])   . Adenomatous polyp   . Anxiety    takes Ativan as needed  . Asthma    Symbicort inhaler and Atrovent neb as needed.Spiriva daily  . Bronchitis   . COPD (chronic obstructive pulmonary disease) ([facility])    Albuerol inhaler as needed.Albuterol neb as needed  . GERD (gastroesophageal reflux disease)    takes Protonix daily  . Hyperlipidemia    takes Welchol and Crestor  daily  . Hypertension    takes Losartan and Amlodipine daily  . Productive cough     Family History  Problem Relation Age of Onset  . Heart disease Mother 4  . Hyperlipidemia Mother   . Heart attack Mother   . Hypertension Mother   . Heart attack Father 9  . Heart disease Father        before age 74  .  Hyperlipidemia Father   . Emphysema Father        was a smoker  . Hypertension Father   . Aneurysm Maternal Grandfather   . Thyroid cancer Brother   . Colon cancer Neg Hx   . Esophageal cancer Neg Hx   . Stomach cancer Neg Hx     SOCIAL HISTORY: Social History   Tobacco Use  . Smoking status: Current Every Day Smoker    Packs/day: 1.00    Years: 45.00    Pack years: 45.00    Types: Cigarettes  . Smokeless tobacco: Never Used  Substance Use Topics  . Alcohol use: No    Alcohol/week: 0.0 standard drinks    Allergies  Allergen Reactions  . No Known Allergies     Current Outpatient Medications  Medication Sig Dispense Refill  . albuterol (PROVENTIL HFA;VENTOLIN HFA) 108 (90 Base) MCG/ACT inhaler Inhale 2 puffs into the lungs every 6 (six) hours as needed for wheezing or shortness of breath.    Marland Kitchen albuterol (PROVENTIL) (2.5 MG/3ML) 0.083% nebulizer solution Take 2.5 mg by nebulization every 6 (six) hours as needed for wheezing or shortness of breath.     Marland Kitchen amLODipine (NORVASC) 5 MG tablet Take 5 mg by mouth every evening.    Marland Kitchen aspirin EC 81 MG  tablet Take 162 mg by mouth daily.     . budesonide-formoterol (SYMBICORT) 160-4.5 MCG/ACT inhaler Inhale 2 puffs into the lungs 2 (two) times daily.    . colesevelam (WELCHOL) 625 MG tablet Take 625 mg by mouth 2 (two) times daily.     Marland Kitchen gabapentin (NEURONTIN) 300 MG capsule Take 300 mg by mouth at bedtime.     Marland Kitchen ipratropium (ATROVENT) 0.02 % nebulizer solution Take 0.5 mg by nebulization every 6 (six) hours as needed for wheezing or shortness of breath.     Marland Kitchen LORazepam (ATIVAN) 1 MG tablet Take 0.5 mg by mouth 3 (three) times daily as needed for anxiety.     Marland Kitchen losartan (COZAAR) 50 MG tablet Take 50 mg by mouth daily.    . rosuvastatin (CRESTOR) 5 MG tablet Take 5 mg by mouth daily with supper.     . tiotropium (SPIRIVA) 18 MCG inhalation capsule Place 18 mcg into inhaler and inhale daily.    . pantoprazole (PROTONIX) 40 MG tablet Take  40 mg by mouth daily before supper.     Vladimir Faster Glycol-Propyl Glycol (SYSTANE OP) Apply 1 drop to eye daily as needed (dry eyes).     No current facility-administered medications for this visit.     REVIEW OF SYSTEMS:  [X]  denotes positive finding, [ ]  denotes negative finding Cardiac  Comments:  Chest pain or chest pressure:    Shortness of breath upon exertion:    Short of breath when lying flat:    Irregular heart rhythm:        Vascular    Pain in calf, thigh, or hip brought on by ambulation:    Pain in feet at night that wakes you up from your sleep:     Blood clot in your veins:    Leg swelling:         Pulmonary    Oxygen at home:    Productive cough:     Wheezing:         Neurologic    Sudden weakness in arms or legs:     Sudden numbness in arms or legs:     Sudden onset of difficulty speaking or slurred speech:    Temporary loss of vision in one eye:     Problems with dizziness:         Gastrointestinal    Blood in stool:     Vomited blood:         Genitourinary    Burning when urinating:     Blood in urine:        Psychiatric    Major depression:         Hematologic    Bleeding problems:    Problems with blood clotting too easily:        Skin    Rashes or ulcers:        Constitutional    Fever or chills:     PHYSICAL EXAM:   Vitals:   02/26/18 1109  BP: 137/82  Pulse: (!) 109  Resp: 16  Temp: (!) 96.7 F (35.9 C)  TempSrc: Oral  SpO2: 95%  Weight: 130 lb (59 kg)  Height: 5' 9"  (1.753 m)   GENERAL: The patient is a well-nourished male, in no acute distress. The vital signs are documented above. CARDIAC: There is a regular rate and rhythm.  VASCULAR: I do not detect carotid bruits. He has palpable femoral pulses. PULMONARY: There is good air exchange bilaterally without wheezing or  rales. ABDOMEN: Soft and non-tender with normal pitched bowel sounds.  MUSCULOSKELETAL: There are no major deformities or cyanosis. NEUROLOGIC: No focal  weakness or paresthesias are detected. SKIN: There are no ulcers or rashes noted. PSYCHIATRIC: The patient has a normal affect.  DATA:    DUPLEX ABDOMINAL AORTA: I have independently interpreted his duplex of the abdominal aorta.  The maximum diameter of the infrarenal aorta is 2.8 cm which is decreased from his original size of 3.2 cm.  The maximum diameter of the right common iliac artery is 2.9 cm.  This was originally 4.8 cm and on his most recent study was 3.4 cm thus this has continued to decrease in size.  MEDICAL ISSUES:   STATUS POST ENDOVASCULAR ANEURYSM REPAIR: The patient is doing well status post endovascular aneurysm repair from May 2018 for a 4.8 cm right common iliac artery aneurysm.  He had coil embolization of his right internal iliac artery.  The aneurysm has continued to decrease in size and is gone from 4.8 to 3.4, up to 2.9.  I have ordered a follow-up duplex in 1 year and I will see him at that time.  He knows to call sooner if he has problems.  Again we have discussed the importance of tobacco cessation.  [name] [facility] [phone]

## 2018-03-26 DIAGNOSIS — J449 Chronic obstructive pulmonary disease, unspecified: Secondary | ICD-10-CM | POA: Diagnosis not present

## 2018-04-08 DIAGNOSIS — I872 Venous insufficiency (chronic) (peripheral): Secondary | ICD-10-CM | POA: Diagnosis not present

## 2018-04-08 DIAGNOSIS — R600 Localized edema: Secondary | ICD-10-CM | POA: Diagnosis not present

## 2018-04-25 DIAGNOSIS — J449 Chronic obstructive pulmonary disease, unspecified: Secondary | ICD-10-CM | POA: Diagnosis not present

## 2018-06-28 DIAGNOSIS — J449 Chronic obstructive pulmonary disease, unspecified: Secondary | ICD-10-CM | POA: Diagnosis not present

## 2018-07-08 DIAGNOSIS — R0902 Hypoxemia: Secondary | ICD-10-CM | POA: Diagnosis not present

## 2018-07-08 DIAGNOSIS — J069 Acute upper respiratory infection, unspecified: Secondary | ICD-10-CM | POA: Diagnosis not present

## 2018-07-08 DIAGNOSIS — J449 Chronic obstructive pulmonary disease, unspecified: Secondary | ICD-10-CM | POA: Diagnosis not present

## 2018-07-28 DIAGNOSIS — J449 Chronic obstructive pulmonary disease, unspecified: Secondary | ICD-10-CM | POA: Diagnosis not present

## 2018-08-07 DIAGNOSIS — J449 Chronic obstructive pulmonary disease, unspecified: Secondary | ICD-10-CM | POA: Diagnosis not present

## 2018-08-25 DIAGNOSIS — J449 Chronic obstructive pulmonary disease, unspecified: Secondary | ICD-10-CM | POA: Diagnosis not present

## 2018-08-28 DIAGNOSIS — J439 Emphysema, unspecified: Secondary | ICD-10-CM | POA: Diagnosis not present

## 2018-08-28 DIAGNOSIS — I10 Essential (primary) hypertension: Secondary | ICD-10-CM | POA: Diagnosis not present

## 2018-08-28 DIAGNOSIS — J984 Other disorders of lung: Secondary | ICD-10-CM | POA: Diagnosis not present

## 2018-08-28 DIAGNOSIS — J449 Chronic obstructive pulmonary disease, unspecified: Secondary | ICD-10-CM | POA: Diagnosis not present

## 2018-08-29 DIAGNOSIS — J449 Chronic obstructive pulmonary disease, unspecified: Secondary | ICD-10-CM | POA: Diagnosis not present

## 2018-08-29 DIAGNOSIS — I10 Essential (primary) hypertension: Secondary | ICD-10-CM | POA: Diagnosis not present

## 2018-08-29 DIAGNOSIS — E7849 Other hyperlipidemia: Secondary | ICD-10-CM | POA: Diagnosis not present

## 2018-08-29 DIAGNOSIS — J984 Other disorders of lung: Secondary | ICD-10-CM | POA: Diagnosis not present

## 2018-09-05 ENCOUNTER — Other Ambulatory Visit: Payer: Self-pay

## 2018-09-05 NOTE — Patient Outreach (Signed)
[place] [facility]) Care Management  09/05/2018  Jared Guerrero April 20, 1945 [id number]   Medication Adherence call to [name] Telephone call to Patient regarding Medication Adherence unable to reach patient. [name] is showing past due on Losartan 100 mg under [place].   [place] Management Direct Dial [phone]  Fax [phone] [name]

## 2018-09-07 DIAGNOSIS — J449 Chronic obstructive pulmonary disease, unspecified: Secondary | ICD-10-CM | POA: Diagnosis not present

## 2018-09-15 DIAGNOSIS — J984 Other disorders of lung: Secondary | ICD-10-CM | POA: Diagnosis not present

## 2018-09-15 DIAGNOSIS — K8020 Calculus of gallbladder without cholecystitis without obstruction: Secondary | ICD-10-CM | POA: Diagnosis not present

## 2018-09-15 DIAGNOSIS — R918 Other nonspecific abnormal finding of lung field: Secondary | ICD-10-CM | POA: Diagnosis not present

## 2018-09-15 DIAGNOSIS — D3502 Benign neoplasm of left adrenal gland: Secondary | ICD-10-CM | POA: Diagnosis not present

## 2018-09-15 DIAGNOSIS — J449 Chronic obstructive pulmonary disease, unspecified: Secondary | ICD-10-CM | POA: Diagnosis not present

## 2018-09-15 DIAGNOSIS — I10 Essential (primary) hypertension: Secondary | ICD-10-CM | POA: Diagnosis not present

## 2018-09-15 DIAGNOSIS — I700 Atherosclerosis of aorta: Secondary | ICD-10-CM | POA: Diagnosis not present

## 2018-09-22 DIAGNOSIS — I10 Essential (primary) hypertension: Secondary | ICD-10-CM | POA: Diagnosis not present

## 2018-09-22 DIAGNOSIS — J449 Chronic obstructive pulmonary disease, unspecified: Secondary | ICD-10-CM | POA: Diagnosis not present

## 2018-09-22 DIAGNOSIS — J9611 Chronic respiratory failure with hypoxia: Secondary | ICD-10-CM | POA: Diagnosis not present

## 2018-09-25 ENCOUNTER — Other Ambulatory Visit: Payer: Self-pay

## 2018-09-25 NOTE — Patient Outreach (Signed)
[name] [facility]) Care Management  09/25/2018  Jared Guerrero 06-25-1945 [id number]   Medication Adherence call to [name] [facility] Identifiers Verify spoke with patient he is past due on Losartan 100 mg patient explain he is taking 1 tablet daily patient was in the process of doing a breathing treatment patient will check and will call back. [name] is showing past due under [place].   [place] Management Direct Dial [phone]  Fax [phone] [name]

## 2018-09-26 DIAGNOSIS — J449 Chronic obstructive pulmonary disease, unspecified: Secondary | ICD-10-CM | POA: Diagnosis not present

## 2018-10-08 DIAGNOSIS — J449 Chronic obstructive pulmonary disease, unspecified: Secondary | ICD-10-CM | POA: Diagnosis not present

## 2018-10-28 DIAGNOSIS — J449 Chronic obstructive pulmonary disease, unspecified: Secondary | ICD-10-CM | POA: Diagnosis not present

## 2018-11-07 DIAGNOSIS — J449 Chronic obstructive pulmonary disease, unspecified: Secondary | ICD-10-CM | POA: Diagnosis not present

## 2018-11-18 DIAGNOSIS — Z23 Encounter for immunization: Secondary | ICD-10-CM | POA: Diagnosis not present

## 2018-11-25 ENCOUNTER — Other Ambulatory Visit: Payer: Self-pay | Admitting: Pulmonary Disease

## 2018-11-25 ENCOUNTER — Other Ambulatory Visit (HOSPITAL_COMMUNITY): Payer: Self-pay | Admitting: Pulmonary Disease

## 2018-11-25 DIAGNOSIS — R911 Solitary pulmonary nodule: Secondary | ICD-10-CM

## 2018-12-08 DIAGNOSIS — J449 Chronic obstructive pulmonary disease, unspecified: Secondary | ICD-10-CM | POA: Diagnosis not present

## 2018-12-19 ENCOUNTER — Ambulatory Visit (HOSPITAL_COMMUNITY)
Admission: RE | Admit: 2018-12-19 | Discharge: 2018-12-19 | Disposition: A | Discharge status: Home / Self Care | Payer: Medicare Other | Source: Ambulatory Visit | Attending: Pulmonary Disease | Admitting: Pulmonary Disease

## 2018-12-19 ENCOUNTER — Other Ambulatory Visit: Payer: Self-pay

## 2018-12-19 DIAGNOSIS — R911 Solitary pulmonary nodule: Secondary | ICD-10-CM | POA: Diagnosis not present

## 2018-12-25 DIAGNOSIS — J9611 Chronic respiratory failure with hypoxia: Secondary | ICD-10-CM | POA: Diagnosis not present

## 2018-12-25 DIAGNOSIS — J449 Chronic obstructive pulmonary disease, unspecified: Secondary | ICD-10-CM | POA: Diagnosis not present

## 2018-12-25 DIAGNOSIS — R911 Solitary pulmonary nodule: Secondary | ICD-10-CM | POA: Diagnosis not present

## 2018-12-25 DIAGNOSIS — I10 Essential (primary) hypertension: Secondary | ICD-10-CM | POA: Diagnosis not present

## 2019-01-07 DIAGNOSIS — J449 Chronic obstructive pulmonary disease, unspecified: Secondary | ICD-10-CM | POA: Diagnosis not present

## 2019-02-03 DIAGNOSIS — J449 Chronic obstructive pulmonary disease, unspecified: Secondary | ICD-10-CM | POA: Diagnosis not present

## 2019-02-07 DIAGNOSIS — J449 Chronic obstructive pulmonary disease, unspecified: Secondary | ICD-10-CM | POA: Diagnosis not present

## 2019-02-10 DIAGNOSIS — J449 Chronic obstructive pulmonary disease, unspecified: Secondary | ICD-10-CM | POA: Diagnosis not present

## 2019-03-10 DIAGNOSIS — J449 Chronic obstructive pulmonary disease, unspecified: Secondary | ICD-10-CM | POA: Diagnosis not present

## 2019-03-18 DIAGNOSIS — J069 Acute upper respiratory infection, unspecified: Secondary | ICD-10-CM | POA: Diagnosis not present

## 2019-03-18 DIAGNOSIS — J449 Chronic obstructive pulmonary disease, unspecified: Secondary | ICD-10-CM | POA: Diagnosis not present

## 2019-04-07 DIAGNOSIS — J449 Chronic obstructive pulmonary disease, unspecified: Secondary | ICD-10-CM | POA: Diagnosis not present

## 2019-04-24 DIAGNOSIS — J449 Chronic obstructive pulmonary disease, unspecified: Secondary | ICD-10-CM | POA: Diagnosis not present

## 2019-04-24 DIAGNOSIS — J069 Acute upper respiratory infection, unspecified: Secondary | ICD-10-CM | POA: Diagnosis not present

## 2019-05-08 DIAGNOSIS — J449 Chronic obstructive pulmonary disease, unspecified: Secondary | ICD-10-CM | POA: Diagnosis not present

## 2019-06-02 DIAGNOSIS — J069 Acute upper respiratory infection, unspecified: Secondary | ICD-10-CM | POA: Diagnosis not present

## 2019-06-02 DIAGNOSIS — J449 Chronic obstructive pulmonary disease, unspecified: Secondary | ICD-10-CM | POA: Diagnosis not present

## 2019-06-07 DIAGNOSIS — J449 Chronic obstructive pulmonary disease, unspecified: Secondary | ICD-10-CM | POA: Diagnosis not present

## 2019-06-17 DIAGNOSIS — J449 Chronic obstructive pulmonary disease, unspecified: Secondary | ICD-10-CM | POA: Diagnosis not present

## 2019-06-17 DIAGNOSIS — J069 Acute upper respiratory infection, unspecified: Secondary | ICD-10-CM | POA: Diagnosis not present

## 2019-07-02 DIAGNOSIS — J449 Chronic obstructive pulmonary disease, unspecified: Secondary | ICD-10-CM | POA: Diagnosis not present

## 2019-07-02 DIAGNOSIS — J069 Acute upper respiratory infection, unspecified: Secondary | ICD-10-CM | POA: Diagnosis not present

## 2019-07-03 DIAGNOSIS — J449 Chronic obstructive pulmonary disease, unspecified: Secondary | ICD-10-CM | POA: Diagnosis not present

## 2019-07-08 DIAGNOSIS — J449 Chronic obstructive pulmonary disease, unspecified: Secondary | ICD-10-CM | POA: Diagnosis not present

## 2019-07-29 DIAGNOSIS — J449 Chronic obstructive pulmonary disease, unspecified: Secondary | ICD-10-CM | POA: Diagnosis not present

## 2019-08-07 DIAGNOSIS — J449 Chronic obstructive pulmonary disease, unspecified: Secondary | ICD-10-CM | POA: Diagnosis not present

## 2019-08-13 ENCOUNTER — Other Ambulatory Visit: Payer: Self-pay

## 2019-08-13 DIAGNOSIS — I714 Abdominal aortic aneurysm, without rupture, unspecified: Secondary | ICD-10-CM

## 2019-08-26 ENCOUNTER — Other Ambulatory Visit (HOSPITAL_COMMUNITY): Payer: Medicare Other

## 2019-08-26 ENCOUNTER — Ambulatory Visit: Payer: Medicare Other | Admitting: Vascular Surgery

## 2019-08-26 DIAGNOSIS — J449 Chronic obstructive pulmonary disease, unspecified: Secondary | ICD-10-CM | POA: Diagnosis not present

## 2019-09-01 DIAGNOSIS — I10 Essential (primary) hypertension: Secondary | ICD-10-CM | POA: Diagnosis not present

## 2019-09-01 DIAGNOSIS — J449 Chronic obstructive pulmonary disease, unspecified: Secondary | ICD-10-CM | POA: Diagnosis not present

## 2019-09-01 DIAGNOSIS — Z1389 Encounter for screening for other disorder: Secondary | ICD-10-CM | POA: Diagnosis not present

## 2019-09-01 DIAGNOSIS — E7849 Other hyperlipidemia: Secondary | ICD-10-CM | POA: Diagnosis not present

## 2019-09-01 DIAGNOSIS — Z0001 Encounter for general adult medical examination with abnormal findings: Secondary | ICD-10-CM | POA: Diagnosis not present

## 2019-09-07 DIAGNOSIS — J449 Chronic obstructive pulmonary disease, unspecified: Secondary | ICD-10-CM | POA: Diagnosis not present

## 2019-10-08 DIAGNOSIS — J449 Chronic obstructive pulmonary disease, unspecified: Secondary | ICD-10-CM | POA: Diagnosis not present

## 2019-11-07 DIAGNOSIS — J449 Chronic obstructive pulmonary disease, unspecified: Secondary | ICD-10-CM | POA: Diagnosis not present

## 2019-11-12 ENCOUNTER — Other Ambulatory Visit: Payer: Self-pay

## 2019-11-12 ENCOUNTER — Encounter (HOSPITAL_COMMUNITY): Payer: Self-pay | Admitting: *Deleted

## 2019-11-12 ENCOUNTER — Emergency Department (HOSPITAL_COMMUNITY)
Admission: EM | Admit: 2019-11-12 | Discharge: 2019-11-12 | Disposition: A | Discharge status: Home / Self Care | Payer: Medicare Other | Attending: Emergency Medicine | Admitting: Emergency Medicine

## 2019-11-12 ENCOUNTER — Emergency Department (HOSPITAL_COMMUNITY): Payer: Medicare Other

## 2019-11-12 DIAGNOSIS — J45909 Unspecified asthma, uncomplicated: Secondary | ICD-10-CM | POA: Diagnosis not present

## 2019-11-12 DIAGNOSIS — J441 Chronic obstructive pulmonary disease with (acute) exacerbation: Secondary | ICD-10-CM

## 2019-11-12 DIAGNOSIS — I10 Essential (primary) hypertension: Secondary | ICD-10-CM | POA: Diagnosis not present

## 2019-11-12 DIAGNOSIS — Z7982 Long term (current) use of aspirin: Secondary | ICD-10-CM | POA: Insufficient documentation

## 2019-11-12 DIAGNOSIS — Z20822 Contact with and (suspected) exposure to covid-19: Secondary | ICD-10-CM | POA: Insufficient documentation

## 2019-11-12 DIAGNOSIS — Z79899 Other long term (current) drug therapy: Secondary | ICD-10-CM | POA: Diagnosis not present

## 2019-11-12 DIAGNOSIS — F17210 Nicotine dependence, cigarettes, uncomplicated: Secondary | ICD-10-CM | POA: Insufficient documentation

## 2019-11-12 DIAGNOSIS — R0602 Shortness of breath: Secondary | ICD-10-CM | POA: Diagnosis not present

## 2019-11-12 DIAGNOSIS — J449 Chronic obstructive pulmonary disease, unspecified: Secondary | ICD-10-CM | POA: Diagnosis not present

## 2019-11-12 LAB — CBC WITH DIFFERENTIAL/PLATELET
Abs Immature Granulocytes: 0.11 10*3/uL — ABNORMAL HIGH (ref 0.00–0.07)
Basophils Absolute: 0.1 10*3/uL (ref 0.0–0.1)
Basophils Relative: 0 %
Eosinophils Absolute: 0.1 10*3/uL (ref 0.0–0.5)
Eosinophils Relative: 1 %
HCT: 36.5 % — ABNORMAL LOW (ref 39.0–52.0)
Hemoglobin: 11.6 g/dL — ABNORMAL LOW (ref 13.0–17.0)
Immature Granulocytes: 1 %
Lymphocytes Relative: 8 %
Lymphs Abs: 1.5 10*3/uL (ref 0.7–4.0)
MCH: 29.2 pg (ref 26.0–34.0)
MCHC: 31.8 g/dL (ref 30.0–36.0)
MCV: 91.9 fL (ref 80.0–100.0)
Monocytes Absolute: 1.1 10*3/uL — ABNORMAL HIGH (ref 0.1–1.0)
Monocytes Relative: 6 %
Neutro Abs: 16.8 10*3/uL — ABNORMAL HIGH (ref 1.7–7.7)
Neutrophils Relative %: 84 %
Platelets: 205 10*3/uL (ref 150–400)
RBC: 3.97 MIL/uL — ABNORMAL LOW (ref 4.22–5.81)
RDW: 17.2 % — ABNORMAL HIGH (ref 11.5–15.5)
WBC: 19.8 10*3/uL — ABNORMAL HIGH (ref 4.0–10.5)
nRBC: 0 % (ref 0.0–0.2)

## 2019-11-12 LAB — RESPIRATORY PANEL BY RT PCR (FLU A&B, COVID)
Influenza A by PCR: NEGATIVE
Influenza B by PCR: NEGATIVE
SARS Coronavirus 2 by RT PCR: NEGATIVE

## 2019-11-12 LAB — COMPREHENSIVE METABOLIC PANEL
ALT: 17 U/L (ref 0–44)
AST: 22 U/L (ref 15–41)
Albumin: 3.5 g/dL (ref 3.5–5.0)
Alkaline Phosphatase: 80 U/L (ref 38–126)
Anion gap: 8 (ref 5–15)
BUN: 22 mg/dL (ref 8–23)
CO2: 32 mmol/L (ref 22–32)
Calcium: 9.3 mg/dL (ref 8.9–10.3)
Chloride: 97 mmol/L — ABNORMAL LOW (ref 98–111)
Creatinine, Ser: 0.73 mg/dL (ref 0.61–1.24)
GFR, Estimated: >60 mL/min (ref >60)
Glucose, Bld: 88 mg/dL (ref 70–99)
Potassium: 3.6 mmol/L (ref 3.5–5.1)
Sodium: 137 mmol/L (ref 135–145)
Total Bilirubin: 0.9 mg/dL (ref 0.3–1.2)
Total Protein: 6.7 g/dL (ref 6.5–8.1)

## 2019-11-12 MED ORDER — IPRATROPIUM-ALBUTEROL 0.5-2.5 (3) MG/3ML IN SOLN
3.0000 mL | Freq: Once | RESPIRATORY_TRACT | Status: AC
  Administered 2019-11-12: 3 mL via RESPIRATORY_TRACT
  Filled 2019-11-12: qty 3

## 2019-11-12 MED ORDER — PREDNISONE 10 MG PO TABS: 20.0000 mg | ORAL_TABLET | Freq: Every day | ORAL | 0 refills | Status: DC

## 2019-11-12 MED ORDER — ALBUTEROL SULFATE (2.5 MG/3ML) 0.083% IN NEBU
2.5000 mg | INHALATION_SOLUTION | Freq: Once | RESPIRATORY_TRACT | Status: AC
  Administered 2019-11-12: 2.5 mg via RESPIRATORY_TRACT
  Filled 2019-11-12: qty 3

## 2019-11-12 MED ORDER — PREDNISONE 50 MG PO TABS
60.0000 mg | ORAL_TABLET | Freq: Once | ORAL | Status: AC
  Administered 2019-11-12: 60 mg via ORAL
  Filled 2019-11-12: qty 1

## 2019-11-12 MED ORDER — ALBUTEROL SULFATE HFA 108 (90 BASE) MCG/ACT IN AERS
2.0000 | INHALATION_SPRAY | RESPIRATORY_TRACT | Status: DC | PRN
  Administered 2019-11-12: 2 via RESPIRATORY_TRACT
  Filled 2019-11-12 (×2): qty 6.7

## 2019-11-12 MED ORDER — LEVOFLOXACIN 500 MG PO TABS
500.0000 mg | ORAL_TABLET | Freq: Once | ORAL | Status: AC
  Administered 2019-11-12: 500 mg via ORAL
  Filled 2019-11-12: qty 1

## 2019-11-12 MED ORDER — LEVOFLOXACIN 500 MG PO TABS: 500.0000 mg | ORAL_TABLET | Freq: Every day | ORAL | 0 refills | Status: DC

## 2019-11-12 NOTE — Discharge Instructions (Addendum)
Use your nebulized treatments as prescribed.  Follow-up with your doctor next week.  If you get worse over the weekend just come back

## 2019-11-12 NOTE — ED Triage Notes (Signed)
Pt with sob and cough since yesterday.  Cough productive white/clear in color per pt . Pt with hx of COPD and is on home O2 at 2 L/M

## 2019-11-12 NOTE — ED Provider Notes (Signed)
[facility] EMERGENCY DEPARTMENT Provider Note   CSN: [id number] Arrival date & time: 11/12/19  1809     History Chief Complaint  Patient presents with  . Shortness of Breath    Jared Guerrero is a 74 y.o. male.  Patient states he has been coughing up clear mucus. Patient complains of some shortness of breath. Patient uses 2 L at home. Patient has bad COPD  The history is provided by the patient and medical records.  Shortness of Breath Severity:  Moderate Onset quality:  Sudden Timing:  Constant Progression:  Waxing and waning Chronicity:  New Context: activity   Relieved by:  Nothing Worsened by:  Nothing Ineffective treatments:  None tried Associated symptoms: no abdominal pain, no chest pain, no cough, no headaches and no rash        Past Medical History:  Diagnosis Date  . AAA (abdominal aortic aneurysm) ([place])   . Adenomatous polyp   . Anxiety    takes Ativan as needed  . Asthma    Symbicort inhaler and Atrovent neb as needed.Spiriva daily  . Bronchitis   . COPD (chronic obstructive pulmonary disease) ([facility])    Albuerol inhaler as needed.Albuterol neb as needed  . GERD (gastroesophageal reflux disease)    takes Protonix daily  . Hyperlipidemia    takes Welchol and Crestor  daily  . Hypertension    takes Losartan and Amlodipine daily  . Productive cough     Patient Active Problem List   Diagnosis Date Noted  . History of abdominal aortic aneurysm (AAA) 06/14/2016  . Preoperative clearance 05/01/2016  . Abnormal CT of the chest 10/10/2015  . History of colonic polyps 10/10/2015  . Iliac artery aneurysm ([place]) 08/05/2013  . Cigarette smoker 12/26/2011  . Essential hypertension 12/20/2011  . Tremor 12/19/2011  . COPD GOLD III still smoking 12/19/2011    Past Surgical History:  Procedure Laterality Date  . ABDOMINAL AORTIC ENDOVASCULAR STENT GRAFT N/A 06/14/2016   Procedure: ABDOMINAL AORTIC ENDOVASCULAR STENT GRAFT;  Surgeon: Angelia Mould, MD;  Location: [place];  Service: Vascular;  Laterality: N/A;  . ABDOMINAL AORTOGRAM N/A 04/16/2016   Procedure: Abdominal Aortogram;  Surgeon: Angelia Mould, MD;  Location: [place] CV LAB;  Service: Cardiovascular;  Laterality: N/A;  . BIOPSY  10/20/2015   Procedure: BIOPSY;  Surgeon: Daneil Dolin, MD;  [facility];  Service: Endoscopy;;  gastric  . COLONOSCOPY  06/17/00   Dr. Vivi Ferns rectum, diminutive polyp in the sigmoid, flat polyp in the cecum, partially removed- adenomatous polyp  . COLONOSCOPY  09/16/00   Dr. Gala Romney- normal appearing rectum, clot overlying a polypectomy site with oozing in the cecum , this lesion was treated, small cecal polyps= tubular adenoma  . COLONOSCOPY N/A 10/20/2015   Procedure: COLONOSCOPY;  Surgeon: Daneil Dolin, MD;  [facility];  Service: Endoscopy;  Laterality: N/A;  12:00 PM  . EMBOLIZATION Right 04/16/2016   Procedure: Embolization;  Surgeon: Angelia Mould, MD;  Location: [place] CV LAB;  Service: Cardiovascular;  Laterality: Right;  internal iliac  . ESOPHAGOGASTRODUODENOSCOPY N/A 10/20/2015   Procedure: ESOPHAGOGASTRODUODENOSCOPY (EGD);  Surgeon: Daneil Dolin, MD;  [facility];  Service: Endoscopy;  Laterality: N/A;  . FOOT SURGERY  2005   right foot   . IR ANGIOGRAM FOLLOW UP STUDY  04/16/2016  . LOWER EXTREMITY ANGIOGRAPHY Bilateral 04/16/2016   Procedure: Lower Extremity Angiography;  Surgeon: Angelia Mould, MD;  Location: [facility]  INVASIVE CV LAB;  Service: Cardiovascular;  Laterality: Bilateral;  . POLYPECTOMY  10/20/2015   Procedure: POLYPECTOMY;  Surgeon: Daneil Dolin, MD;  [facility];  Service: Endoscopy;;  colon  . SPINE SURGERY  1987   L4-5 diskectomy       Family History  Problem Relation Age of Onset  . Heart disease Mother 60  . Hyperlipidemia Mother   . Heart attack Mother   . Hypertension Mother   . Heart attack Father 80  . Heart  disease Father        before age 90  . Hyperlipidemia Father   . Emphysema Father        was a smoker  . Hypertension Father   . Aneurysm Maternal Grandfather   . Thyroid cancer Brother   . Colon cancer Neg Hx   . Esophageal cancer Neg Hx   . Stomach cancer Neg Hx     Social History   Tobacco Use  . Smoking status: Current Every Day Smoker    Packs/day: 0.50    Years: 45.00    Pack years: 22.50    Types: Cigarettes  . Smokeless tobacco: Never Used  Vaping Use  . Vaping Use: Never used  Substance Use Topics  . Alcohol use: No    Alcohol/week: 0.0 standard drinks  . Drug use: No    Home Medications Prior to Admission medications   Medication Sig Start Date End Date Taking? Authorizing Provider  albuterol (PROVENTIL HFA;VENTOLIN HFA) 108 (90 Base) MCG/ACT inhaler Inhale 2 puffs into the lungs every 6 (six) hours as needed for wheezing or shortness of breath.   Yes [provider]  albuterol (PROVENTIL) (2.5 MG/3ML) 0.083% nebulizer solution Take 2.5 mg by nebulization every 6 (six) hours as needed for wheezing or shortness of breath.  03/19/16  Yes [provider]  amLODipine (NORVASC) 5 MG tablet Take 5 mg by mouth every evening.   Yes [provider]  aspirin EC 81 MG tablet Take 162 mg by mouth daily.    Yes [provider]  budesonide-formoterol (SYMBICORT) 160-4.5 MCG/ACT inhaler Inhale 2 puffs into the lungs 2 (two) times daily.   Yes [provider]  colesevelam (WELCHOL) 625 MG tablet Take 625 mg by mouth 2 (two) times daily.    Yes [provider]  gabapentin (NEURONTIN) 300 MG capsule Take 300 mg by mouth at bedtime.  09/07/15  Yes [provider]  ipratropium (ATROVENT) 0.02 % nebulizer solution Take 0.5 mg by nebulization every 6 (six) hours as needed for wheezing or shortness of breath.  02/14/16  Yes [provider]  LORazepam (ATIVAN) 1 MG tablet Take 0.5 mg by mouth 3 (three) times daily as  needed for anxiety.    Yes [provider]  losartan (COZAAR) 50 MG tablet Take 50 mg by mouth daily.   Yes [provider]  rosuvastatin (CRESTOR) 5 MG tablet Take 5 mg by mouth daily with supper.  03/19/16  Yes [provider]  tiotropium (SPIRIVA) 18 MCG inhalation capsule Place 18 mcg into inhaler and inhale daily.   Yes [provider]  levofloxacin (LEVAQUIN) 500 MG tablet Take 1 tablet (500 mg total) by mouth daily. 11/12/19   Milton Ferguson, MD  predniSONE (DELTASONE) 10 MG tablet Take 2 tablets (20 mg total) by mouth daily. 11/12/19   Milton Ferguson, MD    Allergies    No known allergies  Review of Systems   Review of Systems  Constitutional: Negative for appetite change and fatigue.  HENT: Negative for congestion, ear discharge and sinus pressure.   Eyes: Negative for discharge.  Respiratory: Positive for shortness of breath. Negative for cough.   Cardiovascular: Negative for chest pain.  Gastrointestinal: Negative for abdominal pain and diarrhea.  Genitourinary: Negative for frequency and hematuria.  Musculoskeletal: Negative for back pain.  Skin: Negative for rash.  Neurological: Negative for seizures and headaches.  Psychiatric/Behavioral: Negative for hallucinations.    Physical Exam Updated Vital Signs BP 139/76   Pulse (!) 111   Temp 97.9 F (36.6 C) (Oral)   Resp 20   Ht 5\' 8"  (1.727 m)   Wt 52.6 kg   SpO2 93%   BMI 17.64 kg/m   Physical Exam Vitals and nursing note reviewed.  Constitutional:      Appearance: He is well-developed.  HENT:     Head: Normocephalic.     Nose: Nose normal.  Eyes:     General: No scleral icterus.    Conjunctiva/sclera: Conjunctivae normal.  Neck:     Thyroid: No thyromegaly.  Cardiovascular:     Rate and Rhythm: Normal rate and regular rhythm.     Heart sounds: No murmur heard.  No friction rub. No gallop.   Pulmonary:     Breath sounds: No stridor. Wheezing present. No rales.   Chest:     Chest wall: No tenderness.  Abdominal:     General: There is no distension.     Tenderness: There is no abdominal tenderness. There is no rebound.  Musculoskeletal:        General: Normal range of motion.     Cervical back: Neck supple.  Lymphadenopathy:     Cervical: No cervical adenopathy.  Skin:    Findings: No erythema or rash.  Neurological:     Mental Status: He is oriented to person, place, and time.     Motor: No abnormal muscle tone.     Coordination: Coordination normal.  Psychiatric:        Behavior: Behavior normal.     ED Results / Procedures / Treatments   Labs (all labs ordered are listed, but only abnormal results are displayed) Labs Reviewed  CBC WITH DIFFERENTIAL/PLATELET - Abnormal; Notable for the following components:      Result Value   WBC 19.8 (*)    RBC 3.97 (*)    Hemoglobin 11.6 (*)    HCT 36.5 (*)    RDW 17.2 (*)    Neutro Abs 16.8 (*)    Monocytes Absolute 1.1 (*)    Abs Immature Granulocytes 0.11 (*)    All other components within normal limits  COMPREHENSIVE METABOLIC PANEL - Abnormal; Notable for the following components:   Chloride 97 (*)    All other components within normal limits  RESPIRATORY PANEL BY RT PCR (FLU A&B, COVID)    EKG None  Radiology DG Chest Portable 1 View  Result Date: 11/12/2019 CLINICAL DATA:  Shortness of breath EXAM: PORTABLE CHEST 1 VIEW COMPARISON:  08/02/2015 FINDINGS: There is hyperinflation of the lungs compatible with COPD. Airspace opacities in the lung bases, right greater than left. Heart is normal size. No effusions or acute bony abnormality. IMPRESSION: Bilateral lower lobe airspace opacities, right greater than left. This could reflect pneumonia. Particularly right lower lobe. Underlying COPD. Electronically Signed   By: Rolm Baptise M.D.   On: 11/12/2019 18:45    Procedures Procedures (including critical care time)  Medications Ordered in ED Medications  albuterol (VENTOLIN HFA)  108 (90 Base) MCG/ACT inhaler 2 puff (2 puffs Inhalation Given 11/12/19 1916)  levofloxacin (LEVAQUIN) tablet 500 mg (has no administration in time range)  predniSONE (DELTASONE) tablet 60 mg (60 mg Oral Given 11/12/19 1916)  ipratropium-albuterol (DUONEB) 0.5-2.5 (3) MG/3ML nebulizer solution 3 mL (3 mLs Nebulization Given 11/12/19 2116)  albuterol (PROVENTIL) (2.5 MG/3ML) 0.083% nebulizer solution 2.5 mg (2.5 mg Nebulization Given 11/12/19 2116)    ED Course  I have reviewed the triage vital signs and the nursing notes.  Pertinent labs & imaging results that were available during my care of the patient were reviewed by me and considered in my medical decision making (see chart for details).    MDM Rules/Calculators/A&P                            Patient improved with neb treatments. He was also given prednisone and Levaquin. He has exacerbation of his COPD but his sats are still in the low 90s. Chest x-ray shows possible pneumonia. Patient is nontoxic and will be discharged home on prednisone Levaquin and continue with neb treatments. He is to follow-up with his PCP next week or return to the emergency department if necessary  Final Clinical Impression(s) / ED Diagnoses Final diagnoses:  COPD exacerbation ([place])    Rx / DC Orders ED Discharge Orders         Ordered    levofloxacin (LEVAQUIN) 500 MG tablet  Daily        11/12/19 2216    predniSONE (DELTASONE) 10 MG tablet  Daily        11/12/19 2216           Milton Ferguson, MD 11/13/19 [phone]

## 2019-12-08 DIAGNOSIS — J449 Chronic obstructive pulmonary disease, unspecified: Secondary | ICD-10-CM | POA: Diagnosis not present

## 2019-12-16 DIAGNOSIS — J069 Acute upper respiratory infection, unspecified: Secondary | ICD-10-CM | POA: Diagnosis not present

## 2019-12-16 DIAGNOSIS — J449 Chronic obstructive pulmonary disease, unspecified: Secondary | ICD-10-CM | POA: Diagnosis not present

## 2020-01-07 DIAGNOSIS — J449 Chronic obstructive pulmonary disease, unspecified: Secondary | ICD-10-CM | POA: Diagnosis not present

## 2020-01-25 DIAGNOSIS — J449 Chronic obstructive pulmonary disease, unspecified: Secondary | ICD-10-CM | POA: Diagnosis not present

## 2020-01-25 DIAGNOSIS — J069 Acute upper respiratory infection, unspecified: Secondary | ICD-10-CM | POA: Diagnosis not present

## 2020-01-26 ENCOUNTER — Ambulatory Visit: Payer: Medicare Other | Attending: Critical Care Medicine

## 2020-01-26 DIAGNOSIS — Z23 Encounter for immunization: Secondary | ICD-10-CM

## 2020-01-26 NOTE — Progress Notes (Signed)
   Covid-19 Vaccination Clinic  Name:  Jared Guerrero    MRN: [id number] DOB: [birth year]  01/26/2020  Mr. Jared Guerrero was observed post Covid-19 immunization for 15 min without incident. He was provided with Vaccine Information Sheet and instruction to access the V-Safe system.   Mr. Jared Guerrero was instructed to call 911 with any severe reactions post vaccine: Marland Kitchen Difficulty breathing  . Swelling of face and throat  . A fast heartbeat  . A bad rash all over body  . Dizziness and weakness   Immunizations Administered    Name Date Dose VIS Date Route   Moderna Covid-19 Booster Vaccine 01/26/2020 12:08 PM 0.25 mL 11/11/2019 Intramuscular   Manufacturer: Gala Murdoch   Lot: [id number]   NDC: 59458-592-92

## 2020-01-27 ENCOUNTER — Other Ambulatory Visit: Payer: Self-pay

## 2020-01-27 ENCOUNTER — Emergency Department (HOSPITAL_COMMUNITY): Payer: Medicare Other

## 2020-01-27 ENCOUNTER — Encounter (HOSPITAL_COMMUNITY): Payer: Self-pay | Admitting: Emergency Medicine

## 2020-01-27 ENCOUNTER — Inpatient Hospital Stay (HOSPITAL_COMMUNITY)
Admission: EM | Admit: 2020-01-27 | Discharge: 2020-01-29 | DRG: 190 | Disposition: A | Discharge status: Home / Self Care | Payer: Medicare Other | Attending: Family Medicine | Admitting: Family Medicine

## 2020-01-27 DIAGNOSIS — Z808 Family history of malignant neoplasm of other organs or systems: Secondary | ICD-10-CM | POA: Diagnosis not present

## 2020-01-27 DIAGNOSIS — E785 Hyperlipidemia, unspecified: Secondary | ICD-10-CM | POA: Diagnosis not present

## 2020-01-27 DIAGNOSIS — K219 Gastro-esophageal reflux disease without esophagitis: Secondary | ICD-10-CM | POA: Diagnosis present

## 2020-01-27 DIAGNOSIS — Z9981 Dependence on supplemental oxygen: Secondary | ICD-10-CM | POA: Diagnosis not present

## 2020-01-27 DIAGNOSIS — Z825 Family history of asthma and other chronic lower respiratory diseases: Secondary | ICD-10-CM

## 2020-01-27 DIAGNOSIS — M7989 Other specified soft tissue disorders: Secondary | ICD-10-CM | POA: Diagnosis not present

## 2020-01-27 DIAGNOSIS — J441 Chronic obstructive pulmonary disease with (acute) exacerbation: Principal | ICD-10-CM | POA: Diagnosis present

## 2020-01-27 DIAGNOSIS — Z20822 Contact with and (suspected) exposure to covid-19: Secondary | ICD-10-CM | POA: Diagnosis present

## 2020-01-27 DIAGNOSIS — E43 Unspecified severe protein-calorie malnutrition: Secondary | ICD-10-CM | POA: Insufficient documentation

## 2020-01-27 DIAGNOSIS — D649 Anemia, unspecified: Secondary | ICD-10-CM | POA: Diagnosis present

## 2020-01-27 DIAGNOSIS — Z681 Body mass index (BMI) 19 or less, adult: Secondary | ICD-10-CM

## 2020-01-27 DIAGNOSIS — F419 Anxiety disorder, unspecified: Secondary | ICD-10-CM | POA: Diagnosis present

## 2020-01-27 DIAGNOSIS — Z83438 Family history of other disorder of lipoprotein metabolism and other lipidemia: Secondary | ICD-10-CM

## 2020-01-27 DIAGNOSIS — J90 Pleural effusion, not elsewhere classified: Secondary | ICD-10-CM | POA: Diagnosis not present

## 2020-01-27 DIAGNOSIS — J9611 Chronic respiratory failure with hypoxia: Secondary | ICD-10-CM

## 2020-01-27 DIAGNOSIS — Z7951 Long term (current) use of inhaled steroids: Secondary | ICD-10-CM | POA: Diagnosis not present

## 2020-01-27 DIAGNOSIS — Z79899 Other long term (current) drug therapy: Secondary | ICD-10-CM

## 2020-01-27 DIAGNOSIS — M25471 Effusion, right ankle: Secondary | ICD-10-CM

## 2020-01-27 DIAGNOSIS — I10 Essential (primary) hypertension: Secondary | ICD-10-CM | POA: Diagnosis present

## 2020-01-27 DIAGNOSIS — F17210 Nicotine dependence, cigarettes, uncomplicated: Secondary | ICD-10-CM | POA: Diagnosis not present

## 2020-01-27 DIAGNOSIS — I4510 Unspecified right bundle-branch block: Secondary | ICD-10-CM | POA: Diagnosis not present

## 2020-01-27 DIAGNOSIS — I700 Atherosclerosis of aorta: Secondary | ICD-10-CM | POA: Diagnosis not present

## 2020-01-27 DIAGNOSIS — Z7982 Long term (current) use of aspirin: Secondary | ICD-10-CM | POA: Diagnosis not present

## 2020-01-27 DIAGNOSIS — R0602 Shortness of breath: Secondary | ICD-10-CM | POA: Diagnosis not present

## 2020-01-27 DIAGNOSIS — I714 Abdominal aortic aneurysm, without rupture: Secondary | ICD-10-CM | POA: Diagnosis not present

## 2020-01-27 DIAGNOSIS — Z8249 Family history of ischemic heart disease and other diseases of the circulatory system: Secondary | ICD-10-CM

## 2020-01-27 DIAGNOSIS — J189 Pneumonia, unspecified organism: Secondary | ICD-10-CM

## 2020-01-27 DIAGNOSIS — J439 Emphysema, unspecified: Secondary | ICD-10-CM | POA: Diagnosis not present

## 2020-01-27 DIAGNOSIS — J9811 Atelectasis: Secondary | ICD-10-CM | POA: Diagnosis not present

## 2020-01-27 DIAGNOSIS — M19072 Primary osteoarthritis, left ankle and foot: Secondary | ICD-10-CM | POA: Diagnosis not present

## 2020-01-27 LAB — CBC
HCT: 37.5 % — ABNORMAL LOW (ref 39.0–52.0)
Hemoglobin: 11.2 g/dL — ABNORMAL LOW (ref 13.0–17.0)
MCH: 27.8 pg (ref 26.0–34.0)
MCHC: 29.9 g/dL — ABNORMAL LOW (ref 30.0–36.0)
MCV: 93.1 fL (ref 80.0–100.0)
Platelets: 382 10*3/uL (ref 150–400)
RBC: 4.03 MIL/uL — ABNORMAL LOW (ref 4.22–5.81)
RDW: 16.4 % — ABNORMAL HIGH (ref 11.5–15.5)
WBC: 12.2 10*3/uL — ABNORMAL HIGH (ref 4.0–10.5)
nRBC: 0 % (ref 0.0–0.2)

## 2020-01-27 LAB — RESP PANEL BY RT-PCR (FLU A&B, COVID) ARPGX2
Influenza A by PCR: NEGATIVE
Influenza B by PCR: NEGATIVE
SARS Coronavirus 2 by RT PCR: NEGATIVE

## 2020-01-27 LAB — BASIC METABOLIC PANEL WITH GFR
Anion gap: 9 (ref 5–15)
BUN: 24 mg/dL — ABNORMAL HIGH (ref 8–23)
CO2: 34 mmol/L — ABNORMAL HIGH (ref 22–32)
Calcium: 9.1 mg/dL (ref 8.9–10.3)
Chloride: 96 mmol/L — ABNORMAL LOW (ref 98–111)
Creatinine, Ser: 0.78 mg/dL (ref 0.61–1.24)
GFR, Estimated: >60 mL/min
Glucose, Bld: 99 mg/dL (ref 70–99)
Potassium: 4.1 mmol/L (ref 3.5–5.1)
Sodium: 139 mmol/L (ref 135–145)

## 2020-01-27 MED ORDER — LORAZEPAM 0.5 MG PO TABS: 0.5000 mg | ORAL_TABLET | Freq: Three times a day (TID) | ORAL | Status: DC | PRN

## 2020-01-27 MED ORDER — ENOXAPARIN SODIUM 40 MG/0.4ML ~~LOC~~ SOLN
40.0000 mg | SUBCUTANEOUS | Status: DC
  Administered 2020-01-28 (×2): 40 mg via SUBCUTANEOUS
  Filled 2020-01-27 (×3): qty 0.4

## 2020-01-27 MED ORDER — IPRATROPIUM BROMIDE HFA 17 MCG/ACT IN AERS: 2.0000 | INHALATION_SPRAY | Freq: Once | RESPIRATORY_TRACT | Status: DC

## 2020-01-27 MED ORDER — LOSARTAN POTASSIUM 50 MG PO TABS
50.0000 mg | ORAL_TABLET | Freq: Every day | ORAL | Status: DC
  Administered 2020-01-28 – 2020-01-29 (×2): 50 mg via ORAL
  Filled 2020-01-27 (×2): qty 1

## 2020-01-27 MED ORDER — NICOTINE 14 MG/24HR TD PT24
14.0000 mg | MEDICATED_PATCH | Freq: Every day | TRANSDERMAL | Status: DC
  Administered 2020-01-27 – 2020-01-29 (×3): 14 mg via TRANSDERMAL
  Filled 2020-01-27 (×3): qty 1

## 2020-01-27 MED ORDER — DOXYCYCLINE HYCLATE 100 MG PO TABS
100.0000 mg | ORAL_TABLET | Freq: Once | ORAL | Status: AC
  Administered 2020-01-27: 100 mg via ORAL
  Filled 2020-01-27: qty 1

## 2020-01-27 MED ORDER — ALBUTEROL SULFATE (2.5 MG/3ML) 0.083% IN NEBU: 2.5000 mg | INHALATION_SOLUTION | RESPIRATORY_TRACT | Status: DC | PRN

## 2020-01-27 MED ORDER — COLESEVELAM HCL 625 MG PO TABS
625.0000 mg | ORAL_TABLET | Freq: Two times a day (BID) | ORAL | Status: DC
  Administered 2020-01-29: 625 mg via ORAL
  Filled 2020-01-27 (×17): qty 1

## 2020-01-27 MED ORDER — MAGNESIUM SULFATE 2 GM/50ML IV SOLN
2.0000 g | Freq: Once | INTRAVENOUS | Status: AC
  Administered 2020-01-27: 2 g via INTRAVENOUS
  Filled 2020-01-27: qty 50

## 2020-01-27 MED ORDER — IPRATROPIUM-ALBUTEROL 0.5-2.5 (3) MG/3ML IN SOLN: 3.0000 mL | Freq: Four times a day (QID) | RESPIRATORY_TRACT | Status: DC | PRN

## 2020-01-27 MED ORDER — ASPIRIN EC 81 MG PO TBEC
162.0000 mg | DELAYED_RELEASE_TABLET | Freq: Every day | ORAL | Status: DC
  Administered 2020-01-28 – 2020-01-29 (×2): 162 mg via ORAL
  Filled 2020-01-27 (×2): qty 2

## 2020-01-27 MED ORDER — SODIUM CHLORIDE 0.9 % IV SOLN
1.0000 g | INTRAVENOUS | Status: DC
  Administered 2020-01-27: 1 g via INTRAVENOUS
  Filled 2020-01-27: qty 10

## 2020-01-27 MED ORDER — METHYLPREDNISOLONE SODIUM SUCC 125 MG IJ SOLR
125.0000 mg | Freq: Once | INTRAMUSCULAR | Status: AC
  Administered 2020-01-27: 125 mg via INTRAVENOUS
  Filled 2020-01-27: qty 2

## 2020-01-27 MED ORDER — ROSUVASTATIN CALCIUM 10 MG PO TABS
5.0000 mg | ORAL_TABLET | Freq: Every day | ORAL | Status: DC
  Administered 2020-01-28: 5 mg via ORAL
  Filled 2020-01-27: qty 1

## 2020-01-27 MED ORDER — ALBUTEROL (5 MG/ML) CONTINUOUS INHALATION SOLN
10.0000 mg/h | INHALATION_SOLUTION | Freq: Once | RESPIRATORY_TRACT | Status: AC
  Administered 2020-01-27: 10 mg/h via RESPIRATORY_TRACT
  Filled 2020-01-27: qty 20

## 2020-01-27 MED ORDER — GABAPENTIN 300 MG PO CAPS
300.0000 mg | ORAL_CAPSULE | Freq: Every day | ORAL | Status: DC
  Administered 2020-01-27 – 2020-01-28 (×2): 300 mg via ORAL
  Filled 2020-01-27 (×2): qty 1

## 2020-01-27 MED ORDER — SODIUM CHLORIDE 0.9 % IV SOLN
100.0000 mg | Freq: Two times a day (BID) | INTRAVENOUS | Status: DC
  Administered 2020-01-28 – 2020-01-29 (×3): 100 mg via INTRAVENOUS
  Filled 2020-01-27 (×10): qty 100

## 2020-01-27 MED ORDER — ALBUTEROL SULFATE HFA 108 (90 BASE) MCG/ACT IN AERS
4.0000 | INHALATION_SPRAY | Freq: Once | RESPIRATORY_TRACT | Status: AC
  Administered 2020-01-27: 4 via RESPIRATORY_TRACT
  Filled 2020-01-27: qty 6.7

## 2020-01-27 MED ORDER — BUDESONIDE 0.25 MG/2ML IN SUSP
0.2500 mg | Freq: Two times a day (BID) | RESPIRATORY_TRACT | Status: DC
  Administered 2020-01-27 – 2020-01-29 (×4): 0.25 mg via RESPIRATORY_TRACT
  Filled 2020-01-27 (×4): qty 2

## 2020-01-27 MED ORDER — AMLODIPINE BESYLATE 5 MG PO TABS
10.0000 mg | ORAL_TABLET | Freq: Every day | ORAL | Status: DC
  Administered 2020-01-27 – 2020-01-29 (×3): 10 mg via ORAL
  Filled 2020-01-27 (×3): qty 2

## 2020-01-27 MED ORDER — GUAIFENESIN ER 600 MG PO TB12
600.0000 mg | ORAL_TABLET | Freq: Two times a day (BID) | ORAL | Status: DC
  Administered 2020-01-27 – 2020-01-29 (×4): 600 mg via ORAL
  Filled 2020-01-27 (×4): qty 1

## 2020-01-27 MED ORDER — IPRATROPIUM-ALBUTEROL 0.5-2.5 (3) MG/3ML IN SOLN
3.0000 mL | Freq: Four times a day (QID) | RESPIRATORY_TRACT | Status: DC
  Administered 2020-01-27 – 2020-01-28 (×5): 3 mL via RESPIRATORY_TRACT
  Filled 2020-01-27 (×3): qty 3

## 2020-01-27 MED ORDER — AEROCHAMBER PLUS FLO-VU MEDIUM MISC
1.0000 | Freq: Once | Status: AC
  Administered 2020-01-27: 1
  Filled 2020-01-27: qty 1

## 2020-01-27 MED ORDER — SODIUM CHLORIDE 0.9 % IV SOLN: 500.0000 mg | INTRAVENOUS | Status: DC

## 2020-01-27 MED ORDER — METHYLPREDNISOLONE SODIUM SUCC 125 MG IJ SOLR
60.0000 mg | Freq: Four times a day (QID) | INTRAMUSCULAR | Status: DC
  Administered 2020-01-28 – 2020-01-29 (×5): 60 mg via INTRAVENOUS
  Filled 2020-01-27 (×5): qty 2

## 2020-01-27 NOTE — ED Provider Notes (Signed)
[facility] EMERGENCY DEPARTMENT Provider Note   CSN: [id number] Arrival date & time: 01/27/20  1448     History Chief Complaint  Patient presents with  . Shortness of Breath    Jared Guerrero is a 75 y.o. male.  HPI   75 year old male with a history of AAA, anxiety, COPD, GERD, hyperlipidemia, hypertension, who presents the emergency department today for evaluation of shortness of breath.  States he has felt more short of breath for the last few days.  He has COPD and is chronically on 2 L at home.  Reports some chest congestion, chronic cough that is worse and also states he has had more sputum production than normal.  He denies any chest pain or pleuritic pain.  He denies any bilateral lower extremity edema.    He states that he is fully vaccinated against Covid and got his booster shot yesterday.  Past Medical History:  Diagnosis Date  . AAA (abdominal aortic aneurysm) ([place])   . Adenomatous polyp   . Anxiety    takes Ativan as needed  . Asthma    Symbicort inhaler and Atrovent neb as needed.Spiriva daily  . Bronchitis   . COPD (chronic obstructive pulmonary disease) ([facility])    Albuerol inhaler as needed.Albuterol neb as needed  . GERD (gastroesophageal reflux disease)    takes Protonix daily  . Hyperlipidemia    takes Welchol and Crestor  daily  . Hypertension    takes Losartan and Amlodipine daily  . Productive cough     Patient Active Problem List   Diagnosis Date Noted  . History of abdominal aortic aneurysm (AAA) 06/14/2016  . Preoperative clearance 05/01/2016  . Abnormal CT of the chest 10/10/2015  . History of colonic polyps 10/10/2015  . Iliac artery aneurysm ([place]) 08/05/2013  . Cigarette smoker 12/26/2011  . Essential hypertension 12/20/2011  . Tremor 12/19/2011  . COPD GOLD III still smoking 12/19/2011    Past Surgical History:  Procedure Laterality Date  . ABDOMINAL AORTIC ENDOVASCULAR STENT GRAFT N/A 06/14/2016   Procedure: ABDOMINAL AORTIC  ENDOVASCULAR STENT GRAFT;  Surgeon: Angelia Mould, MD;  Location: [place];  Service: Vascular;  Laterality: N/A;  . ABDOMINAL AORTOGRAM N/A 04/16/2016   Procedure: Abdominal Aortogram;  Surgeon: Angelia Mould, MD;  Location: [place] [facility];  Service: Cardiovascular;  Laterality: N/A;  . BIOPSY  10/20/2015   Procedure: BIOPSY;  Surgeon: Daneil Dolin, MD;  [facility];  Service: Endoscopy;;  gastric  . COLONOSCOPY  06/17/00   Dr. Vivi Ferns rectum, diminutive polyp in the sigmoid, flat polyp in the cecum, partially removed- adenomatous polyp  . COLONOSCOPY  09/16/00   Dr. Gala Romney- normal appearing rectum, clot overlying a polypectomy site with oozing in the cecum , this lesion was treated, small cecal polyps= tubular adenoma  . COLONOSCOPY N/A 10/20/2015   Procedure: COLONOSCOPY;  Surgeon: Daneil Dolin, MD;  [facility];  Service: Endoscopy;  Laterality: N/A;  12:00 PM  . EMBOLIZATION Right 04/16/2016   Procedure: Embolization;  Surgeon: Angelia Mould, MD;  Location: [place] [facility];  Service: Cardiovascular;  Laterality: Right;  internal iliac  . ESOPHAGOGASTRODUODENOSCOPY N/A 10/20/2015   Procedure: ESOPHAGOGASTRODUODENOSCOPY (EGD);  Surgeon: Daneil Dolin, MD;  [facility];  Service: Endoscopy;  Laterality: N/A;  . FOOT SURGERY  2005   right foot   . IR ANGIOGRAM FOLLOW UP STUDY  04/16/2016  . LOWER EXTREMITY ANGIOGRAPHY Bilateral 04/16/2016   Procedure:  Lower Extremity Angiography;  Surgeon: Chuck Hint, MD;  [facility] INVASIVE [facility];  Service: Cardiovascular;  Laterality: Bilateral;  . POLYPECTOMY  10/20/2015   Procedure: POLYPECTOMY;  Surgeon: Corbin Ade, MD;  [facility];  Service: Endoscopy;;  colon  . SPINE SURGERY  1987   L4-5 diskectomy       Family History  Problem Relation Age of Onset  . Heart disease Mother 24  . Hyperlipidemia Mother   . Heart attack Mother   . Hypertension Mother    . Heart attack Father 69  . Heart disease Father        before age 64  . Hyperlipidemia Father   . Emphysema Father        was a smoker  . Hypertension Father   . Aneurysm Maternal Grandfather   . Thyroid cancer Brother   . Colon cancer Neg Hx   . Esophageal cancer Neg Hx   . Stomach cancer Neg Hx     Social History   Tobacco Use  . Smoking status: Current Every Day Smoker    Packs/day: 0.50    Years: 45.00    Pack years: 22.50    Types: Cigarettes  . Smokeless tobacco: Never Used  Vaping Use  . Vaping Use: Never used  Substance Use Topics  . Alcohol use: No    Alcohol/week: 0.0 standard drinks  . Drug use: No    Home Medications Prior to Admission medications   Medication Sig Start Date End Date Taking? Authorizing Provider  albuterol (PROVENTIL HFA;VENTOLIN HFA) 108 (90 Base) MCG/ACT inhaler Inhale 2 puffs into the lungs every 6 (six) hours as needed for wheezing or shortness of breath.   Yes [provider]  albuterol (PROVENTIL) (2.5 MG/3ML) 0.083% nebulizer solution Take 2.5 mg by nebulization every 6 (six) hours as needed for wheezing or shortness of breath.  03/19/16  Yes [provider]  amLODipine (NORVASC) 10 MG tablet Take 10 mg by mouth. 01/18/20  Yes [provider]  aspirin EC 81 MG tablet Take 162 mg by mouth daily.    Yes [provider]  budesonide-formoterol (SYMBICORT) 160-4.5 MCG/ACT inhaler Inhale 2 puffs into the lungs 2 (two) times daily.   Yes [provider]  colesevelam (WELCHOL) 625 MG tablet Take 625 mg by mouth 2 (two) times daily.   Yes [provider]  dextromethorphan-guaiFENesin (MUCINEX DM) 30-600 MG 12hr tablet Take 1 tablet by mouth 2 (two) times daily.   Yes [provider]  gabapentin (NEURONTIN) 300 MG capsule Take 300 mg by mouth at bedtime.  09/07/15  Yes [provider]  ipratropium (ATROVENT) 0.02 % nebulizer solution Take 0.5 mg by nebulization every 6 (six)  hours as needed for wheezing or shortness of breath.  02/14/16  Yes [provider]  LORazepam (ATIVAN) 1 MG tablet Take 0.5 mg by mouth 3 (three) times daily as needed for anxiety.    Yes [provider]  losartan (COZAAR) 50 MG tablet Take 50 mg by mouth daily.   Yes [provider]  rosuvastatin (CRESTOR) 5 MG tablet Take 5 mg by mouth daily with supper.  03/19/16  Yes [provider]  tiotropium (SPIRIVA) 18 MCG inhalation capsule Place 18 mcg into inhaler and inhale daily.   Yes [provider]  levofloxacin (LEVAQUIN) 500 MG tablet Take 1 tablet (500 mg total) by mouth daily. Patient not taking: No sig reported 11/12/19   Bethann Berkshire, MD  predniSONE (  DELTASONE) 10 MG tablet Take 2 tablets (20 mg total) by mouth daily. Patient not taking: No sig reported 11/12/19   Milton Ferguson, MD    Allergies    No known allergies  Review of Systems   Review of Systems  Constitutional: Negative for chills and fever.  HENT: Negative for ear pain and sore throat.   Eyes: Negative for visual disturbance.  Respiratory: Positive for cough and shortness of breath.   Cardiovascular: Negative for chest pain and leg swelling.  Gastrointestinal: Negative for abdominal pain, constipation, diarrhea, nausea and vomiting.  Genitourinary: Negative for dysuria and hematuria.  Musculoskeletal: Negative for back pain.  Skin: Negative for rash.  Neurological: Negative for seizures and syncope.  All other systems reviewed and are negative.   Physical Exam Updated Vital Signs BP 121/63   Pulse 97   Temp 98.2 F (36.8 C) (Oral)   Resp 16   Ht 5\' 9"  (1.753 m)   Wt 52.2 kg   SpO2 100%   BMI 16.98 kg/m   Physical Exam Vitals and nursing note reviewed.  Constitutional:      Appearance: He is well-developed and well-nourished.  HENT:     Head: Normocephalic and atraumatic.  Eyes:     Conjunctiva/sclera: Conjunctivae normal.  Cardiovascular:     Rate and  Rhythm: Normal rate and regular rhythm.     Heart sounds: Normal heart sounds. No murmur heard.   Pulmonary:     Effort: Tachypnea present. No respiratory distress.     Breath sounds: Decreased breath sounds present.     Comments: Conversationally dyspenic. Pursed lip breathing with retractions noted.  Abdominal:     General: Bowel sounds are normal.     Palpations: Abdomen is soft.     Tenderness: There is no abdominal tenderness. There is no guarding or rebound.  Musculoskeletal:        General: No edema.     Cervical back: Neck supple.     Comments: Trace ble edema w/o calf ttp  Skin:    General: Skin is warm and dry.  Neurological:     Mental Status: He is alert.  Psychiatric:        Mood and Affect: Mood and affect normal.     ED Results / Procedures / Treatments   Labs (all labs ordered are listed, but only abnormal results are displayed) Labs Reviewed  BASIC METABOLIC PANEL - Abnormal; Notable for the following components:      Result Value   Chloride 96 (*)    CO2 34 (*)    BUN 24 (*)    All other components within normal limits  CBC - Abnormal; Notable for the following components:   WBC 12.2 (*)    RBC 4.03 (*)    Hemoglobin 11.2 (*)    HCT 37.5 (*)    MCHC 29.9 (*)    RDW 16.4 (*)    All other components within normal limits  RESP PANEL BY RT-PCR (FLU A&B, COVID) [id number]    EKG EKG Interpretation  Date/Time:  Wednesday January 27 2020 14:55:56 EST Ventricular Rate:  94 PR Interval:  116 QRS Duration: 134 QT Interval:  420 QTC Calculation: 525 R Axis:   43 Text Interpretation: Normal sinus rhythm Right bundle branch block Poor data quality Confirmed by Lajean Saver [phone]) on 01/27/2020 3:32:42 PM   Radiology No results found.  Procedures Procedures (including critical care time)  CRITICAL CARE Performed by: Rodney Booze   Total critical  care time: 37 minutes  Critical care time was exclusive of separately billable procedures and  treating other patients.  Critical care was necessary to treat or prevent imminent or life-threatening deterioration.  Critical care was time spent personally by me on the following activities: development of treatment plan with patient and/or surrogate as well as nursing, discussions with consultants, evaluation of patient's response to treatment, examination of patient, obtaining history from patient or surrogate, ordering and performing treatments and interventions, ordering and review of laboratory studies, ordering and review of radiographic studies, pulse oximetry and re-evaluation of patient's condition.   Medications Ordered in ED Medications  ipratropium (ATROVENT HFA) inhaler 2 puff (2 puffs Inhalation Not Given 01/27/20 1710)  doxycycline (VIBRA-TABS) tablet 100 mg (has no administration in time range)  magnesium sulfate IVPB 2 g 50 mL (has no administration in time range)  albuterol (VENTOLIN HFA) 108 (90 Base) MCG/ACT inhaler 4 puff (4 puffs Inhalation Given 01/27/20 1631)  AeroChamber Plus Flo-Vu Medium MISC 1 each (1 each Other Given 01/27/20 1631)  methylPREDNISolone sodium succinate (SOLU-MEDROL) 125 mg/2 mL injection 125 mg (125 mg Intravenous Given 01/27/20 1631)  albuterol (PROVENTIL,VENTOLIN) solution continuous neb (10 mg/hr Nebulization Given 01/27/20 1807)    ED Course  I have reviewed the triage vital signs and the nursing notes.  Pertinent labs & imaging results that were available during my care of the patient were reviewed by me and considered in my medical decision making (see chart for details).    MDM Rules/Calculators/A&P                          75 y/o M presenting for eval of sob. H/o copd on chronic o2.   Reviewed/interprerted labs CBC with mild leukocytosis and mild anemia BMP with elevated co2 and mildly elevated BUN covid negative  EKG Normal sinus rhythm Right bundle branch block Poor data quality   CXR reviewed/interpreted - Emphysematous lung disease  is noted with diffuse chronic appearing increased interstitial lung markings. Mild areas of atelectasis and/or infiltrate are seen within the bilateral lung bases and lateral aspect of the mid to upper right lung. Small bilateral pleural effusions are noted. No pneumothorax is seen. The heart size and mediastinal contours are within normal limits. There is moderate severity calcification of the aortic arch. Stenting of the visualized portion of the abdominal aorta is noted. The visualized skeletal structures are unremarkable. IMPRESSION: 1. COPD with mild bilateral atelectasis and/or infiltrate. 2. Small bilateral pleural effusions.  Pt was given steroids, an albuterol inhaler and a continuous neb in the ED. His sxs improved somewhat however he still has pursed lip breathing and conversational dyspnea. Will admit for COPD exacerbation.   8:05 PM CONSULT with Dr. Marlowe Sax with hospitalist service who accepts patient for admission   Final Clinical Impression(s) / ED Diagnoses Final diagnoses:  COPD exacerbation [facility])    Rx / DC Orders ED Discharge Orders    None       Bishop Dublin 01/27/20 2005    Lajean Saver, MD 01/27/20 2146

## 2020-01-27 NOTE — ED Notes (Signed)
Pt did not complain of SOB while ambulating in room. Pt O2 stayed between 95-97%.

## 2020-01-27 NOTE — H&P (Addendum)
History and Physical    Jared Guerrero [id number] DOB: 1945-08-16 DOA: 01/27/2020  PCP: Sharilyn Sites, MD Patient coming from: Home  Chief Complaint: Shortness of breath  HPI: Jared Guerrero is a 75 y.o. male with medical history significant of AAA, anxiety, COPD, ongoing tobacco use, GERD, hyperlipidemia, hypertension presenting to the ED with complaints of shortness of breath. Patient reports 3 to 4-day history of dyspnea, wheezing, and productive cough.  Patient does not have power at his house so he has been living with his son who has noticed that his breathing is much worse from his baseline.  Patient normally wears 2 L home oxygen all the time.  He has been fully vaccinated against Covid, including booster shot which she received yesterday.  Reports compliance with his home COPD inhalers and son at bedside confirms.  Denies fevers, chills, chest pain, nausea, vomiting, abdominal pain, diarrhea, or dysuria.Denies history of blood clots.  ED Course: Vital signs stable.  Satting well on 2 L home oxygen.  WBC 12.2, hemoglobin 11.2, hematocrit 37.5, platelet count 382K.  Sodium 139, potassium 4.1, chloride 96, bicarb 34, BUN 24, creatinine 0.7, glucose 99.  SARS-CoV-2 PCR test and influenza panel both negative.  Chest x-ray showing COPD with mild bilateral atelectasis and/or infiltrate.  Small bilateral pleural effusions.  Patient was given albuterol inhaler treatment, doxycycline, IV Solu-Medrol 125 mg, and IV magnesium 2 g.  Also required an hour-long albuterol continuous neb treatment.  Admission requested for COPD exacerbation and possible pneumonia.  Review of Systems:  All systems reviewed and apart from history of presenting illness, are negative.  Past Medical History:  Diagnosis Date   AAA (abdominal aortic aneurysm) (HCC)    Adenomatous polyp    Anxiety    takes Ativan as needed   Asthma    Symbicort inhaler and Atrovent neb as needed.Spiriva daily   Bronchitis     COPD (chronic obstructive pulmonary disease) (HCC)    Albuerol inhaler as needed.Albuterol neb as needed   GERD (gastroesophageal reflux disease)    takes Protonix daily   Hyperlipidemia    takes Welchol and Crestor  daily   Hypertension    takes Losartan and Amlodipine daily   Productive cough     Past Surgical History:  Procedure Laterality Date   ABDOMINAL AORTIC ENDOVASCULAR STENT GRAFT N/A 06/14/2016   Procedure: ABDOMINAL AORTIC ENDOVASCULAR STENT GRAFT;  Surgeon: Angelia Mould, MD;  Location: [facility];  Service: Vascular;  Laterality: N/A;   ABDOMINAL AORTOGRAM N/A 04/16/2016   Procedure: Abdominal Aortogram;  Surgeon: Angelia Mould, MD;  Location: [facility];  Service: Cardiovascular;  Laterality: N/A;   BIOPSY  10/20/2015   Procedure: BIOPSY;  Surgeon: Daneil Dolin, MD;  [facility];  Service: Endoscopy;;  gastric   COLONOSCOPY  06/17/00   Dr. Vivi Ferns rectum, diminutive polyp in the sigmoid, flat polyp in the cecum, partially removed- adenomatous polyp   COLONOSCOPY  09/16/00   Dr. Gala Romney- normal appearing rectum, clot overlying a polypectomy site with oozing in the cecum , this lesion was treated, small cecal polyps= tubular adenoma   COLONOSCOPY N/A 10/20/2015   Procedure: COLONOSCOPY;  Surgeon: Daneil Dolin, MD;  [facility];  Service: Endoscopy;  Laterality: N/A;  12:00 PM   EMBOLIZATION Right 04/16/2016   Procedure: Embolization;  Surgeon: Angelia Mould, MD;  Location: [facility];  Service: Cardiovascular;  Laterality: Right;  internal iliac   ESOPHAGOGASTRODUODENOSCOPY N/A 10/20/2015  Procedure: ESOPHAGOGASTRODUODENOSCOPY (EGD);  Surgeon: Corbin Ade, MD;  [facility];  Service: Endoscopy;  Laterality: N/A;   FOOT SURGERY  2005   right foot    IR ANGIOGRAM FOLLOW UP STUDY  04/16/2016   LOWER EXTREMITY ANGIOGRAPHY Bilateral 04/16/2016   Procedure: Lower Extremity Angiography;   Surgeon: Chuck Hint, MD;  Location: [facility];  Service: Cardiovascular;  Laterality: Bilateral;   POLYPECTOMY  10/20/2015   Procedure: POLYPECTOMY;  Surgeon: Corbin Ade, MD;  [facility];  Service: Endoscopy;;  colon   SPINE SURGERY  1987   L4-5 diskectomy     reports that he has been smoking cigarettes. He has a 22.50 pack-year smoking history. He has never used smokeless tobacco. He reports that he does not drink alcohol and does not use drugs.  Allergies  Allergen Reactions   No Known Allergies     Family History  Problem Relation Age of Onset   Heart disease Mother 97   Hyperlipidemia Mother    Heart attack Mother    Hypertension Mother    Heart attack Father 75   Heart disease Father        before age 67   Hyperlipidemia Father    Emphysema Father        was a smoker   Hypertension Father    Aneurysm Maternal Grandfather    Thyroid cancer Brother    Colon cancer Neg Hx    Esophageal cancer Neg Hx    Stomach cancer Neg Hx     Prior to Admission medications   Medication Sig Start Date End Date Taking? Authorizing Provider  albuterol (PROVENTIL HFA;VENTOLIN HFA) 108 (90 Base) MCG/ACT inhaler Inhale 2 puffs into the lungs every 6 (six) hours as needed for wheezing or shortness of breath.   Yes [provider]  albuterol (PROVENTIL) (2.5 MG/3ML) 0.083% nebulizer solution Take 2.5 mg by nebulization every 6 (six) hours as needed for wheezing or shortness of breath.  03/19/16  Yes [provider]  amLODipine (NORVASC) 10 MG tablet Take 10 mg by mouth. 01/18/20  Yes [provider]  aspirin EC 81 MG tablet Take 162 mg by mouth daily.    Yes [provider]  budesonide-formoterol (SYMBICORT) 160-4.5 MCG/ACT inhaler Inhale 2 puffs into the lungs 2 (two) times daily.   Yes [provider]  colesevelam (WELCHOL) 625 MG tablet Take 625 mg by mouth 2 (two) times daily.   Yes [provider]  dextromethorphan-guaiFENesin (MUCINEX DM) 30-600 MG 12hr tablet Take 1 tablet by mouth 2 (two) times daily.   Yes [provider]  gabapentin (NEURONTIN) 300 MG capsule Take 300 mg by mouth at bedtime.  09/07/15  Yes [provider]  ipratropium (ATROVENT) 0.02 % nebulizer solution Take 0.5 mg by nebulization every 6 (six) hours as needed for wheezing or shortness of breath.  02/14/16  Yes [provider]  LORazepam (ATIVAN) 1 MG tablet Take 0.5 mg by mouth 3 (three) times daily as needed for anxiety.    Yes [provider]  losartan (COZAAR) 50 MG tablet Take 50 mg by mouth daily.   Yes [provider]  rosuvastatin (CRESTOR) 5 MG tablet Take 5 mg by mouth daily with supper.  03/19/16  Yes [provider]  tiotropium (SPIRIVA) 18 MCG inhalation capsule Place 18 mcg into inhaler and inhale daily.   Yes [provider]  levofloxacin (LEVAQUIN) 500 MG tablet Take 1 tablet (500  mg total) by mouth daily. Patient not taking: No sig reported 11/12/19   Milton Ferguson, MD  predniSONE (DELTASONE) 10 MG tablet Take 2 tablets (20 mg total) by mouth daily. Patient not taking: No sig reported 11/12/19   Milton Ferguson, MD    Physical Exam: Vitals:   01/27/20 1807 01/27/20 1821 01/27/20 1830 01/27/20 1900  BP:  131/84 126/77 121/63  Pulse:  91 94 97  Resp:  16 18 16   Temp:      TempSrc:      SpO2: 96% 100% 100% 100%  Weight:      Height:        Physical Exam Constitutional:      General: He is not in acute distress. HENT:     Head: Normocephalic and atraumatic.  Eyes:     Extraocular Movements: Extraocular movements intact.     Conjunctiva/sclera: Conjunctivae normal.  Cardiovascular:     Rate and Rhythm: Normal rate and regular rhythm.     Pulses: Normal pulses.  Pulmonary:     Breath sounds: No wheezing or rales.     Comments: Pursed lip breathing Diminished breath sounds bilaterally Abdominal:     General:  Bowel sounds are normal. There is no distension.     Palpations: Abdomen is soft.     Tenderness: There is no abdominal tenderness.  Musculoskeletal:     Cervical back: Normal range of motion and neck supple.     Right lower leg: Edema present.     Left lower leg: Edema present.     Comments: +1 pitting edema of bilateral lower extremities  Skin:    General: Skin is warm and dry.  Neurological:     General: No focal deficit present.     Mental Status: He is alert and oriented to person, place, and time.     Labs on Admission: I have personally reviewed following labs and imaging studies  CBC: Recent Labs  Lab 01/27/20 1541  WBC 12.2*  HGB 11.2*  HCT 37.5*  MCV 93.1  PLT 99991111   Basic Metabolic Panel: Recent Labs  Lab 01/27/20 1541  NA 139  K 4.1  CL 96*  CO2 34*  GLUCOSE 99  BUN 24*  CREATININE 0.78  CALCIUM 9.1   GFR: Estimated Creatinine Clearance: 58.9 mL/min (by C-G formula based on SCr of 0.78 mg/dL). Liver Function Tests: No results for input(s): AST, ALT, ALKPHOS, BILITOT, PROT, ALBUMIN in the last 168 hours. No results for input(s): LIPASE, AMYLASE in the last 168 hours. No results for input(s): AMMONIA in the last 168 hours. Coagulation Profile: No results for input(s): INR, PROTIME in the last 168 hours. Cardiac Enzymes: No results for input(s): CKTOTAL, CKMB, CKMBINDEX, TROPONINI in the last 168 hours. BNP (last 3 results) No results for input(s): PROBNP in the last 8760 hours. HbA1C: No results for input(s): HGBA1C in the last 72 hours. CBG: No results for input(s): GLUCAP in the last 168 hours. Lipid Profile: No results for input(s): CHOL, HDL, LDLCALC, TRIG, CHOLHDL, LDLDIRECT in the last 72 hours. Thyroid Function Tests: No results for input(s): TSH, T4TOTAL, FREET4, T3FREE, THYROIDAB in the last 72 hours. Anemia Panel: No results for input(s): VITAMINB12, FOLATE, FERRITIN, TIBC, IRON, RETICCTPCT in the last 72 hours. Urine analysis:     Component Value Date/Time   COLORURINE STRAW (A) 06/06/2016 1006   APPEARANCEUR CLEAR 06/06/2016 1006   LABSPEC 1.003 (L) 06/06/2016 1006   PHURINE 6.0 06/06/2016 [place] 06/06/2016 1006  HGBUR SMALL (A) 06/06/2016 1006   BILIRUBINUR NEGATIVE 06/06/2016 [place] 06/06/2016 1006   PROTEINUR NEGATIVE 06/06/2016 1006   NITRITE NEGATIVE 06/06/2016 1006   LEUKOCYTESUR NEGATIVE 06/06/2016 1006    Radiological Exams on Admission: No results found.  EKG: Independently reviewed.  Poor quality study with artifact making interpretation challenging.  Repeat study has been ordered and currently pending.  CXR  "EXAM: PORTABLE CHEST 1 VIEW  COMPARISON: November 12, 2019  FINDINGS: Emphysematous lung disease is noted with diffuse chronic appearing increased interstitial lung markings. Mild areas of atelectasis and/or infiltrate are seen within the bilateral lung bases and lateral aspect of the mid to upper right lung. Small bilateral pleural effusions are noted. No pneumothorax is seen. The heart size and mediastinal contours are within normal limits. There is moderate severity calcification of the aortic arch. Stenting of the visualized portion of the abdominal aorta is noted. The visualized skeletal structures are unremarkable.  IMPRESSION: 1. COPD with mild bilateral atelectasis and/or infiltrate. 2. Small bilateral pleural effusions.   Electronically Signed By: Virgina Norfolk M.D. On: 01/27/2020 16:53"  Assessment/Plan Principal Problem:   COPD exacerbation ([place]) Active Problems:   Essential hypertension   Cigarette smoker   CAP (community acquired pneumonia)   Chronic hypoxemic respiratory failure (HCC)   Acute COPD exacerbation: Presenting with complaints of dyspnea, wheezing, and productive cough.  Appears dyspneic when speaking and has pursed lip breathing.  Patient was given IV Solu-Medrol, IV magnesium 2 g, and albuterol inhaler  treatment in the ED.  Subsequently required an hour-long albuterol continuous neb treatment. -Continue IV Solu-Medrol 60 mg every 6 hours, DuoNebs every 6 hours, albuterol nebulizer as needed, Pulmicort nebulizer twice daily, Mucinex, and continue antibiotics.  Flutter valve as needed.   Possible CAP: Chest x-ray showing mild bilateral atelectasis and/or infiltrate.  SARS-CoV-2 PCR test and influenza panel both negative.  Patient is fully vaccinated against Covid.  Labs showing borderline leukocytosis (WBC 12.2).  No fever, tachycardia, or other signs of sepsis. -Ceftriaxone and azithromycin.  Check procalcitonin level.  Continue to monitor WBC count.  Addendum: Ceftriaxone and doxycycline given.  Azithromycin canceled due to slight QT prolongation on EKG.  Chronic hypoxemic respiratory failure: Stable.  Currently satting in the mid 90s on 2 L home oxygen which he uses all the time. -Continuous pulse ox, continue supplemental oxygen  Hypertension: Stable. -Continue home amlodipine and losartan  Hyperlipidemia -Continue home Crestor  Normocytic anemia: Hemoglobin 11.2, MCV 93.1.  Hemoglobin stable compared to labs done in October 2021. -Continue to monitor  Anxiety -Continue home Ativan PRN  Tobacco use: Smokes 1/2 pack of cigarettes daily. -NicoDerm patch and counseling  Physical deconditioning -PT/OT consulted  DVT prophylaxis: Lovenox Code Status: Patient wishes to be full code. Family Communication: Son at bedside. Disposition Plan: Status is: Inpatient  Remains inpatient appropriate because:IV treatments appropriate due to intensity of illness or inability to take PO and Inpatient level of care appropriate due to severity of illness   Dispo: The patient is from: Home              Anticipated d/c is to: SNF              Anticipated d/c date is: 3 days              Patient currently is not medically stable to d/c.   The medical decision making on this patient was of high  complexity and the patient is at high risk for  clinical deterioration, therefore this is a level 3 visit.  Shela Leff MD [facility]  If 7PM-7AM, please contact night-coverage [email or web address]  01/27/2020, 9:38 PM

## 2020-01-27 NOTE — ED Triage Notes (Signed)
Pt c/o shortness of breath for the past two days.  Pt wears 2L O2 [place] at home and sats are 97 in triage.

## 2020-01-28 DIAGNOSIS — F17210 Nicotine dependence, cigarettes, uncomplicated: Secondary | ICD-10-CM | POA: Diagnosis not present

## 2020-01-28 DIAGNOSIS — J441 Chronic obstructive pulmonary disease with (acute) exacerbation: Principal | ICD-10-CM

## 2020-01-28 DIAGNOSIS — J9611 Chronic respiratory failure with hypoxia: Secondary | ICD-10-CM | POA: Diagnosis not present

## 2020-01-28 DIAGNOSIS — I10 Essential (primary) hypertension: Secondary | ICD-10-CM

## 2020-01-28 DIAGNOSIS — E43 Unspecified severe protein-calorie malnutrition: Secondary | ICD-10-CM | POA: Insufficient documentation

## 2020-01-28 DIAGNOSIS — R0602 Shortness of breath: Secondary | ICD-10-CM

## 2020-01-28 LAB — CBC
HCT: 33.2 % — ABNORMAL LOW (ref 39.0–52.0)
Hemoglobin: 10.3 g/dL — ABNORMAL LOW (ref 13.0–17.0)
MCH: 28.1 pg (ref 26.0–34.0)
MCHC: 31 g/dL (ref 30.0–36.0)
MCV: 90.7 fL (ref 80.0–100.0)
Platelets: 343 10*3/uL (ref 150–400)
RBC: 3.66 MIL/uL — ABNORMAL LOW (ref 4.22–5.81)
RDW: 16.3 % — ABNORMAL HIGH (ref 11.5–15.5)
WBC: 8 10*3/uL (ref 4.0–10.5)
nRBC: 0 % (ref 0.0–0.2)

## 2020-01-28 LAB — PROCALCITONIN: Procalcitonin: <0.1 ng/mL

## 2020-01-28 LAB — HIV ANTIBODY (ROUTINE TESTING W REFLEX): HIV Screen 4th Generation wRfx: NONREACTIVE

## 2020-01-28 MED ORDER — IPRATROPIUM-ALBUTEROL 0.5-2.5 (3) MG/3ML IN SOLN
3.0000 mL | Freq: Three times a day (TID) | RESPIRATORY_TRACT | Status: DC
  Administered 2020-01-29: 3 mL via RESPIRATORY_TRACT
  Filled 2020-01-28 (×2): qty 3

## 2020-01-28 MED ORDER — ENSURE ENLIVE PO LIQD
237.0000 mL | Freq: Two times a day (BID) | ORAL | Status: DC
  Administered 2020-01-28: 237 mL via ORAL

## 2020-01-28 MED ORDER — ADULT MULTIVITAMIN W/MINERALS CH
1.0000 | ORAL_TABLET | Freq: Every day | ORAL | Status: DC
  Administered 2020-01-28 – 2020-01-29 (×2): 1 via ORAL
  Filled 2020-01-28 (×2): qty 1

## 2020-01-28 NOTE — Progress Notes (Signed)
PROGRESS NOTE   Jared Guerrero  [id number] DOB: 17-Aug-1945 DOA: 01/27/2020 PCP: Sharilyn Sites, MD   Chief Complaint  Patient presents with  . Shortness of Breath    Brief Admission History:  75 y.o. male with medical history significant of AAA, anxiety, COPD, ongoing tobacco use, GERD, hyperlipidemia, hypertension presenting to the ED with complaints of shortness of breath. Patient reports 3 to 4-day history of dyspnea, wheezing, and productive cough.  Patient does not have power at his house so he has been living with his son who has noticed that his breathing is much worse from his baseline.  Patient normally wears 2 L home oxygen all the time.  He has been fully vaccinated against Covid, including booster shot which she received yesterday.  Reports compliance with his home COPD inhalers and son at bedside confirms.  Denies fevers, chills, chest pain, nausea, vomiting, abdominal pain, diarrhea, or dysuria.Denies history of blood clots.  Assessment & Plan:   Principal Problem:   COPD exacerbation ([place]) Active Problems:   Essential hypertension   Cigarette smoker   CAP (community acquired pneumonia)   Chronic hypoxemic respiratory failure ([place])  1. COPD exacerbation - Pt continues to have dyspnea and pursed lip breathing but some improved but says that neb treatments only help intermittently, continue aggressive measures with IV steroids, mucinex, antibiotics and flutter valve.  DC ceftriaxone, continue doxycycline.  2. Essential hypertension - BPs stable.  Following.  3. CAP - ruled out with negative procalcitonin and negative viral testing.  4. Tobacco - counseled on cessation, nicotine patch ordered.  5. Physical deconditioning -PT eval appreciated.  6. GAD - lorazepam PRN.  7. Chronic hypoxemic respiratory failure - hopefully can wean him down to home oxygen requirements of 2L/min [place].   DVT prophylaxis: enoxaparin  Code Status: full  Family Communication: son updated by  admitter  Disposition:   Status is: Inpatient  Remains inpatient appropriate because:IV treatments appropriate due to intensity of illness or inability to take PO and Inpatient level of care appropriate due to severity of illness   Dispo: The patient is from: Home              Anticipated d/c is to: Home              Anticipated d/c date is: 1 day              Patient currently is not medically stable to d/c.   Consultants:   PT   Procedures:   N/a   Antimicrobials:  Doxycycline 1/5>> Ceftriaxone 1/5>>   Subjective: Pt reports neb treatments only help intermittently with his shortness of breath  Objective: Vitals:   01/28/20 0202 01/28/20 0406 01/28/20 0815 01/28/20 1038  BP:  115/69  (!) 120/55  Pulse:  81  83  Resp:  15  18  Temp:  (!) 97.4 F (36.3 C)  97.8 F (36.6 C)  TempSrc:    Oral  SpO2: 96% 95% 96% 97%  Weight:      Height:        Intake/Output Summary (Last 24 hours) at 01/28/2020 1242 Last data filed at 01/28/2020 1011 Gross per 24 hour  Intake 339.8 ml  Output 1 ml  Net 338.8 ml   Filed Weights   01/27/20 1454  Weight: 52.2 kg    Examination:  General exam: frail, thin, chronically ill appearing male, sitting up in chair, pursed lip breathing.  Respiratory system: diffuse expiratory wheezes heard posteriorly Cardiovascular system: S1 &  S2 heard. No JVD, murmurs, rubs, gallops or clicks. No pedal edema. Gastrointestinal system: Abdomen is nondistended, soft and nontender. No organomegaly or masses felt. Normal bowel sounds heard. Central nervous system: Alert and oriented. No focal neurological deficits. Extremities: Symmetric 5 x 5 power. Skin: No rashes, lesions or ulcers Psychiatry: Judgement and insight appear normal. Mood & affect appropriate.   Data Reviewed: I have personally reviewed following labs and imaging studies  CBC: Recent Labs  Lab 01/27/20 1541 01/28/20 0829  WBC 12.2* 8.0  HGB 11.2* 10.3*  HCT 37.5* 33.2*  MCV  93.1 90.7  PLT 382 343    Basic Metabolic Panel: Recent Labs  Lab 01/27/20 1541  NA 139  K 4.1  CL 96*  CO2 34*  GLUCOSE 99  BUN 24*  CREATININE 0.78  CALCIUM 9.1    GFR: Estimated Creatinine Clearance: 58.9 mL/min (by C-G formula based on SCr of 0.78 mg/dL).  Liver Function Tests: No results for input(s): AST, ALT, ALKPHOS, BILITOT, PROT, ALBUMIN in the last 168 hours.  CBG: No results for input(s): GLUCAP in the last 168 hours.  Recent Results (from the past 240 hour(s))  Resp Panel by RT-PCR (Flu A&B, Covid) Nasopharyngeal Swab     Status: None   Collection Time: 01/27/20  4:28 PM   Specimen: Nasopharyngeal Swab; Nasopharyngeal(NP) swabs in vial transport medium  Result Value Ref Range Status   SARS Coronavirus 2 by RT PCR NEGATIVE NEGATIVE Final    Comment: (NOTE) SARS-CoV-2 target nucleic acids are NOT DETECTED.  The SARS-CoV-2 RNA is generally detectable in upper respiratory specimens during the acute phase of infection. The lowest concentration of SARS-CoV-2 viral copies this assay can detect is 138 copies/mL. A negative result does not preclude SARS-Cov-2 infection and should not be used as the sole basis for treatment or other patient management decisions. A negative result may occur with  improper specimen collection/handling, submission of specimen other than nasopharyngeal swab, presence of viral mutation(s) within the areas targeted by this assay, and inadequate number of viral copies(<138 copies/mL). A negative result must be combined with clinical observations, patient history, and epidemiological information. The expected result is Negative.  Fact Sheet for Patients:  BloggerCourse.com  Fact Sheet for Healthcare Providers:  SeriousBroker.it  This test is no t yet approved or cleared by the [place] FDA and  has been authorized for detection and/or diagnosis of SARS-CoV-2 by FDA under an  Emergency Use Authorization (EUA). This EUA will remain  in effect (meaning this test can be used) for the duration of the COVID-19 declaration under Section 564(b)(1) of the Act, 21 U.S.C.section 360bbb-3(b)(1), unless the authorization is terminated  or revoked sooner.       Influenza A by PCR NEGATIVE NEGATIVE Final   Influenza B by PCR NEGATIVE NEGATIVE Final    Comment: (NOTE) The Xpert Xpress SARS-CoV-2/FLU/RSV plus assay is intended as an aid in the diagnosis of influenza from Nasopharyngeal swab specimens and should not be used as a sole basis for treatment. Nasal washings and aspirates are unacceptable for Xpert Xpress SARS-CoV-2/FLU/RSV testing.  Fact Sheet for Patients: BloggerCourse.com  Fact Sheet for Healthcare Providers: SeriousBroker.it  This test is not yet approved or cleared by the [place] FDA and has been authorized for detection and/or diagnosis of SARS-CoV-2 by FDA under an Emergency Use Authorization (EUA). This EUA will remain in effect (meaning this test can be used) for the duration of the COVID-19 declaration under Section 564(b)(1) of the Act, 21 U.S.C.  section 360bbb-3(b)(1), unless the authorization is terminated or revoked.  Performed at [facility], [address], [place], [place] [address]      Radiology Studies: [facility] 1 View  Result Date: 01/27/2020 CLINICAL DATA:  Shortness of breath x2 days. EXAM: PORTABLE CHEST 1 VIEW COMPARISON:  November 12, 2019 FINDINGS: Emphysematous lung disease is noted with diffuse chronic appearing increased interstitial lung markings. Mild areas of atelectasis and/or infiltrate are seen within the bilateral lung bases and lateral aspect of the mid to upper right lung. Small bilateral pleural effusions are noted. No pneumothorax is seen. The heart size and mediastinal contours are within normal limits. There is moderate severity calcification of the aortic  arch. Stenting of the visualized portion of the abdominal aorta is noted. The visualized skeletal structures are unremarkable. IMPRESSION: 1. COPD with mild bilateral atelectasis and/or infiltrate. 2. Small bilateral pleural effusions. Electronically Signed   By: Virgina Norfolk M.D.   On: 01/27/2020 16:53   Scheduled Meds: . amLODipine  10 mg Oral Daily  . aspirin EC  162 mg Oral Daily  . budesonide (PULMICORT) nebulizer solution  0.25 mg Nebulization BID  . colesevelam  625 mg Oral BID  . enoxaparin (LOVENOX) injection  40 mg Subcutaneous Q24H  . gabapentin  300 mg Oral QHS  . guaiFENesin  600 mg Oral BID  . ipratropium-albuterol  3 mL Nebulization Q6H  . losartan  50 mg Oral Daily  . methylPREDNISolone (SOLU-MEDROL) injection  60 mg Intravenous Q6H  . nicotine  14 mg Transdermal Daily  . rosuvastatin  5 mg Oral Q supper   Continuous Infusions: . cefTRIAXone (ROCEPHIN)  IV Stopped (01/27/20 2325)  . doxycycline (VIBRAMYCIN) IV 100 mg (01/28/20 1033)     LOS: 1 day   Time spent: 50 mins   Kainon Varady Wynetta Emery, MD How to contact the [facility] Attending or Consulting provider [place] or covering provider during after hours [place], for this patient?  1. Check the care team in [facility] and look for a) attending/consulting [facility] provider listed and b) the [facility] team listed 2. Log into [email or web address] and use [place]'s universal password to access. If you do not have the password, please contact the hospital operator. 3. Locate the [facility] provider you are looking for under [facility] and page to a number that you can be directly reached. 4. If you still have difficulty reaching the provider, please page the [facility] (Director on Call) for the Hospitalists listed on amion for assistance.  01/28/2020, 12:42 PM

## 2020-01-28 NOTE — Evaluation (Addendum)
Physical Therapy Evaluation Patient Details Name: Jared Guerrero MRN: [id number] DOB: January 28, 1945 Today's Date: 01/28/2020   History of Present Illness  Jared Guerrero is a 75 y.o. male with medical history significant of AAA, anxiety, COPD, ongoing tobacco use, GERD, hyperlipidemia, hypertension presenting to the ED with complaints of shortness of breath. Patient reports 3 to 4-day history of dyspnea, wheezing, and productive cough.  Patient does not have power at his house so he has been living with his son who has noticed that his breathing is much worse from his baseline.  Patient normally wears 2 L home oxygen all the time.  He has been fully vaccinated against Covid, including booster shot which she received yesterday.  Reports compliance with his home COPD inhalers and son at bedside confirms.    Clinical Impression  Patient functioning at baseline for functional mobility and gait other than requiring standing rest break before returning to room during ambulation.  Patient on 2 LPM with SpO2 between 91-93% during ambulation and encouraged to stay out of bed and ambulate in room ad lib for length of stay - RN notified.  Plan:  Patient discharged from physical therapy to care of nursing for ambulation daily as tolerated for length of stay.     Follow Up Recommendations No PT follow up    Equipment Recommendations  None recommended by PT    Recommendations for Other Services       Precautions / Restrictions Precautions Precautions: Fall Restrictions Weight Bearing Restrictions: No      Mobility  Bed Mobility Overal bed mobility: Modified Independent                  Transfers Overall transfer level: Modified independent Equipment used: Straight cane             General transfer comment: demonstrates good return for using [facility] for transfers and sit to stands, and able to transfer safely without [place] of AD  Ambulation/Gait Ambulation/Gait assistance: Modified  independent (Device/Increase time);Supervision Gait Distance (Feet): 65 Feet Assistive device: Straight cane Gait Pattern/deviations: Decreased step length - left;Decreased stance time - right;Decreased stride length;Step-to pattern Gait velocity: decreased   General Gait Details: slightly labored cadence without loss of balance with good return for using SPC with mostly 3 point gait pattern  Stairs            Wheelchair Mobility    Modified Rankin (Stroke Patients Only)       Balance Overall balance assessment: Needs assistance Sitting-balance support: Feet supported;No upper extremity supported Sitting balance-Leahy Scale: Good Sitting balance - Comments: seated at EOB   Standing balance support: During functional activity;Single extremity supported Standing balance-Leahy Scale: Fair Standing balance comment: fair/good using SPC                             Pertinent Vitals/Pain Pain Assessment: No/denies pain    Home Living Family/patient expects to be discharged to:: Private residence Living Arrangements: Spouse/significant other Available Help at Discharge: Family;Available 24 hours/day Type of Home: House Home Access: Stairs to enter Entrance Stairs-Rails: None Entrance Stairs-Number of Steps: 2 Home Layout: One level Home Equipment: Walker - 2 wheels;Cane - single point;Shower seat;Grab bars - tub/shower      Prior Function Level of Independence: Needs assistance   Gait / Transfers Assistance Needed: household ambulator leaning on furniture/walls, occasional use of RW or SPC  ADL's / Homemaking Assistance Needed: community ADLs assisted by  family  Comments: wife is assisting with ADLs-dressing, bathing, balancing during tasks     Hand Dominance   Dominant Hand: Right    Extremity/Trunk Assessment   Upper Extremity Assessment Upper Extremity Assessment: Defer to OT evaluation    Lower Extremity Assessment Lower Extremity Assessment:  Overall WFL for tasks assessed    Cervical / Trunk Assessment Cervical / Trunk Assessment: Kyphotic  Communication   Communication: No difficulties  Cognition Arousal/Alertness: Awake/alert Behavior During Therapy: WFL for tasks assessed/performed Overall Cognitive Status: Within Functional Limits for tasks assessed                                        General Comments      Exercises     Assessment/Plan    PT Assessment Patent does not need any further PT services  PT Problem List         PT Treatment Interventions      PT Goals (Current goals can be found in the Care Plan section)  Acute Rehab PT Goals Patient Stated Goal: return home with family to assist PT Goal Formulation: With patient Time For Goal Achievement: 01/28/20 Potential to Achieve Goals: Good    Frequency     Barriers to discharge        Co-evaluation               AM-PAC PT "6 Clicks" Mobility  Outcome Measure Help needed turning from your back to your side while in a flat bed without using bedrails?: None Help needed moving from lying on your back to sitting on the side of a flat bed without using bedrails?: None Help needed moving to and from a bed to a chair (including a wheelchair)?: None Help needed standing up from a chair using your arms (e.g., wheelchair or bedside chair)?: None Help needed to walk in hospital room?: A Little Help needed climbing 3-5 steps with a railing? : A Little 6 Click Score: 22    End of Session Equipment Utilized During Treatment: Oxygen Activity Tolerance: Patient tolerated treatment well;Patient limited by fatigue Patient left: in chair;with call bell/phone within reach   PT Visit Diagnosis: Unsteadiness on feet (R26.81);Other abnormalities of gait and mobility (R26.89);Muscle weakness (generalized) (M62.81)    Time: 5701-7793 PT Time Calculation (min) (ACUTE ONLY): 16 min   Charges:   PT Evaluation $PT Eval Low Complexity: 1  Low PT Treatments $Therapeutic Activity: 8-22 mins        11:49 AM, 01/28/20 Ocie Bob, MPT Physical Therapist with [facility] 336 [phone] office [phone] mobile phone

## 2020-01-28 NOTE — Evaluation (Signed)
Occupational Therapy Evaluation Patient Details Name: Jared Guerrero MRN: [id number] DOB: [birth year] Today's Date: 01/28/2020    History of Present Illness Jared Guerrero is a 75 y.o. male with medical history significant of AAA, anxiety, COPD, ongoing tobacco use, GERD, hyperlipidemia, hypertension presenting to the ED with complaints of shortness of breath. Patient reports 3 to 4-day history of dyspnea, wheezing, and productive cough.  Patient does not have power at his house so he has been living with his son who has noticed that his breathing is much worse from his baseline.  Patient normally wears 2 L home oxygen all the time.  He has been fully vaccinated against Covid, including booster shot which she received yesterday.  Reports compliance with his home COPD inhalers and son at bedside confirms.   Clinical Impression   Pt agreeable to OT evaluation. Pt performing tasks with supervision using RW for mobility. Pt with mild lightheaded feeling upon standing, resolving shortly with PLB. Pt reports wife assists at home due to SOB and limited ability to stand for long periods or bend at waist. Pt is at baseline for ADL completion, educated on energy conservation strategies to save energy and improve activity tolerance. No further OT services required at this time.     Follow Up Recommendations  No OT follow up;Supervision/Assistance - 24 hour    Equipment Recommendations  None recommended by OT       Precautions / Restrictions Precautions Precautions: Fall Restrictions Weight Bearing Restrictions: No      Mobility Bed Mobility               General bed mobility comments: seated at EOB on OT arrival    Transfers Overall transfer level: Needs assistance Equipment used: Rolling walker (2 wheeled) Transfers: Sit to/from [employer] Transfers Sit to Stand: Supervision Stand pivot transfers: Supervision                ADL either performed or assessed with  clinical judgement   ADL Overall ADL's : Needs assistance/impaired     Grooming: Wash/dry hands;Supervision/safety;Standing Grooming Details (indicate cue type and reason): pt standing at sink for grooming tasks, reminders for PLB towards end of task Upper Body Bathing: Minimal assistance;Sitting   Lower Body Bathing: Maximal assistance;Sitting/lateral leans Lower Body Bathing Details (indicate cue type and reason): limited in bending at waist due to SOB Upper Body Dressing : Modified independent;Sitting   Lower Body Dressing: Maximal assistance;Sitting/lateral leans;Sit to/from stand Lower Body Dressing Details (indicate cue type and reason): limited due to SOB, wife assists at home Toilet Transfer: Supervision/safety;Ambulation;RW   Toileting- Clothing Manipulation and Hygiene: Supervision/safety;Sitting/lateral lean;Sit to/from stand       Functional mobility during ADLs: Supervision/safety;Rolling walker       Vision Baseline Vision/History: No visual deficits Patient Visual Report: No change from baseline Vision Assessment?: No apparent visual deficits            Pertinent Vitals/Pain Pain Assessment: No/denies pain     Hand Dominance Right   Extremity/Trunk Assessment Upper Extremity Assessment Upper Extremity Assessment: Overall WFL for tasks assessed   Lower Extremity Assessment Lower Extremity Assessment: Defer to PT evaluation   Cervical / Trunk Assessment Cervical / Trunk Assessment: Kyphotic   Communication Communication Communication: No difficulties   Cognition Arousal/Alertness: Awake/alert Behavior During Therapy: WFL for tasks assessed/performed Overall Cognitive Status: Within Functional Limits for tasks assessed  Home Living Family/patient expects to be discharged to:: Private residence Living Arrangements: Spouse/significant other Available Help at Discharge:  Family;Available 24 hours/day Type of Home: House Home Access: Stairs to enter [employer] of Steps: 2 Entrance Stairs-Rails: None Home Layout: One level     Bathroom Shower/Tub: Chief Strategy Officer: Standard     Home Equipment: Environmental consultant - 2 wheels;Cane - single point;Shower seat;Grab bars - tub/shower          Prior Functioning/Environment Level of Independence: Independent        Comments: wife is assisting with ADLs-dressing, bathing, balancing during tasks        OT Problem List: Cardiopulmonary status limiting activity;Decreased activity tolerance       End of Session Equipment Utilized During Treatment: Rolling walker;Oxygen  Activity Tolerance: Patient tolerated treatment well Patient left: in chair;with call bell/phone within reach;with chair alarm set  OT Visit Diagnosis: Muscle weakness (generalized) (M62.81)                Time: 3734-2876 OT Time Calculation (min): 23 min Charges:  OT General Charges $OT Visit: 1 Visit OT Evaluation $OT Eval Low Complexity: 1 Low OT Treatments $Self Care/Home Management : 8-22 mins   Ezra Sites, OTR/L  [phone] 01/28/2020, 8:00 AM

## 2020-01-28 NOTE — Plan of Care (Signed)

## 2020-01-28 NOTE — Progress Notes (Signed)
Initial Nutrition Assessment  DOCUMENTATION CODES:   Underweight,Severe malnutrition in context of chronic illness  INTERVENTION:  Ensure Enlive po BID, each supplement provides 350 kcal and 20 grams of protein (chocolate)  Magic cup BID with meals, each supplement provides 290 kcal and 9 grams of protein  CIB po daily with breakfast, each supplement with 237 ml whole milk provides 280 kcal and 13 grams of protein  Liberalize diet  Education and coupons provided   NUTRITION DIAGNOSIS:   Severe Malnutrition related to chronic illness (COPD) as evidenced by severe fat depletion,severe muscle depletion.    GOAL:   Patient will meet greater than or equal to 90% of their needs    MONITOR:   PO intake,Supplement acceptance,Weight trends,Labs,I & O's,Skin  REASON FOR ASSESSMENT:   Malnutrition Screening Tool,Consult Assessment of nutrition requirement/status  ASSESSMENT:   75 year old male admitted for COPD exacerbation presented with 3-4 day history of increased SOB from baseline, wheezing, and productive cough. Past medical history significant of AAA, anxiety, ongoing tobacco use, GERD, HLD, HTN, and COPD on 2 L O2 at baseline.  Patient out of bed, sitting in recliner this morning. He reports 100% of breakfast this morning. He reports having a good appetite, however intake is limited secondary to chronic fatigue and shortness of breath. Recalls unable to cook meals, usually resorts to ready made meals and recalls liking (eggs, yogurt, cottage cheese with fruit. He receives a case of Boost each month through the [place], he tries to drink 1 each day, but does not like the taste. 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 He feels his best in the early mornings and evenings when wife is resting. He endorses weight loss, recalls usual weight around 170 lbs 3 years ago. Per chart, his weights decreased ~15 lbs (11.5%) from  02/20-10/21 and appear stable in the last 2.5 months. He is underweight and has severe fat and muscle wasting to entire body, meeting criteria for severe malnutrition. RD provided cold Ensure from nourishment room with cup of ice, pt agreeable to drinking BID. Will also order Magic Cup with lunch and dinner and recommend liberalizing to regular diet as Heart Healthy diet limits fat as well as protein. Spoke with MD via secure chat, okay to liberalize diet.   RD discussed strategies to increase po intake throughout the day, encouraged eating larger meals when feeling good, recommended increasing supplement intake and provided suggestions for altering taste (pouring over ice, mixing half with whole fat milk, adding to ice cream, using as coffee creamer), educated on weighs to increase calories/protein and provided handout as well as coupons.   Medications reviewed and include: Gabapentin, Methylprednisolone, Rocephin, Doxycycline  Labs reviewed   NUTRITION - FOCUSED PHYSICAL EXAM:  Flowsheet Row Most Recent Value  Orbital Region Severe depletion  Upper Arm Region Severe depletion  Thoracic and Lumbar Region Severe depletion  Buccal Region Severe depletion  Temple Region Severe depletion  Clavicle Bone Region Severe depletion  Clavicle and Acromion Bone Region Severe depletion  Scapular Bone Region Severe depletion  Dorsal Hand Severe depletion  Patellar Region Severe depletion  Anterior Thigh Region Unable to assess  [wearing pants from home]  Posterior Calf Region Severe depletion  Edema (RD Assessment) None  Hair Reviewed  Eyes Reviewed  Mouth Reviewed  Skin Reviewed  [scattered ecchymosis]  Nails Reviewed       Diet Order:   Diet Order  Diet regular Room service appropriate? Yes; Fluid consistency: Thin  Diet effective now                 EDUCATION NEEDS:   Education needs have been addressed  Skin:  Skin Assessment: Reviewed RN Assessment  Last BM:   1/3  Height:   Ht Readings from Last 1 Encounters:  01/28/20 5\' 9"  (1.753 m)    Weight:   Wt Readings from Last 1 Encounters:  01/27/20 52.2 kg     BMI:  Body mass index is 16.98 kg/m.  Estimated Nutritional Needs:   Kcal:  1700-1900  Protein:  75-85  Fluid:  >/= 1.5 L/day    Lajuan Lines, RD, LDN Clinical Nutrition After Hours/Weekend Pager # in [place]

## 2020-01-29 ENCOUNTER — Inpatient Hospital Stay (HOSPITAL_COMMUNITY): Payer: Medicare Other

## 2020-01-29 DIAGNOSIS — J441 Chronic obstructive pulmonary disease with (acute) exacerbation: Secondary | ICD-10-CM | POA: Diagnosis not present

## 2020-01-29 DIAGNOSIS — J9611 Chronic respiratory failure with hypoxia: Secondary | ICD-10-CM | POA: Diagnosis not present

## 2020-01-29 DIAGNOSIS — R0602 Shortness of breath: Secondary | ICD-10-CM

## 2020-01-29 DIAGNOSIS — F17210 Nicotine dependence, cigarettes, uncomplicated: Secondary | ICD-10-CM | POA: Diagnosis not present

## 2020-01-29 DIAGNOSIS — I10 Essential (primary) hypertension: Secondary | ICD-10-CM | POA: Diagnosis not present

## 2020-01-29 MED ORDER — DOXYCYCLINE HYCLATE 100 MG PO CAPS: 100.0000 mg | ORAL_CAPSULE | Freq: Two times a day (BID) | ORAL | 0 refills | Status: AC

## 2020-01-29 MED ORDER — PREDNISONE 20 MG PO TABS: ORAL_TABLET | ORAL | 0 refills | Status: AC

## 2020-01-29 MED ORDER — ADULT MULTIVITAMIN W/MINERALS CH: 1.0000 | ORAL_TABLET | Freq: Every day | ORAL | Status: AC

## 2020-01-29 NOTE — Discharge Instructions (Signed)
Chronic Obstructive Pulmonary Disease Exacerbation  Chronic obstructive pulmonary disease (COPD) is a long-term (chronic) condition that affects the lungs. COPD is a general term that can be used to describe many different lung problems that cause lung swelling (inflammation) and limit airflow, including chronic bronchitis and emphysema. COPD exacerbations are episodes when breathing symptoms become much worse and require extra treatment. COPD exacerbations are usually caused by infections. Without treatment, COPD exacerbations can be severe and even life threatening. Frequent COPD exacerbations can cause further damage to the lungs. What are the causes? This condition may be caused by:  Respiratory infections, including viral and bacterial infections.  Exposure to smoke.  Exposure to air pollution, chemical fumes, or dust.  Things that give you an allergic reaction (allergens).  Not taking your usual COPD medicines as directed.  Underlying medical problems, such as congestive heart failure or infections not involving the lungs. In many cases, the cause (trigger) of this condition is not known. What increases the risk? The following factors may make you more likely to develop this condition:  Smoking cigarettes.  Old age.  Frequent prior COPD exacerbations. What are the signs or symptoms? Symptoms of this condition include:  Increased coughing.  Increased production of mucus from your lungs (sputum).  Increased wheezing.  Increased shortness of breath.  Rapid or labored breathing.  Chest tightness.  Less energy than usual.  Sleep disruption from symptoms.  Confusion or increased sleepiness. Often these symptoms happen or get worse even with the use of medicines. How is this diagnosed? This condition is diagnosed based on:  Your medical history.  A physical exam. You may also have tests, including:  A chest X-ray.  Blood tests.  Lung (pulmonary) function  tests. How is this treated? Treatment for this condition depends on the severity and cause of the symptoms. You may need to be admitted to a hospital for treatment. Some of the treatments commonly used to treat COPD exacerbations are:  Antibiotic medicines. These may be used for severe exacerbations caused by a lung infection, such as pneumonia.  Bronchodilators. These are inhaled medicines that expand the air passages and allow increased airflow.  Steroid medicines. These act to reduce inflammation in the airways. They may be given with an inhaler, taken by mouth, or given through an IV tube inserted into one of your veins.  Supplemental oxygen therapy.  Airway clearing techniques, such as noninvasive ventilation (NIV) and positive expiratory pressure (PEP). These provide respiratory support through a mask or other noninvasive device. An example of this would be using a continuous positive airway pressure (CPAP) machine to improve delivery of oxygen into your lungs. Follow these instructions at home: Medicines  Take over-the-counter and prescription medicines only as told by your health care provider. It is important to use correct technique with inhaled medicines.  If you were prescribed an antibiotic medicine or oral steroid, take it as told by your health care provider. Do not stop taking the medicine even if you start to feel better. Lifestyle  Eat a healthy diet.  Exercise regularly.  Get plenty of sleep.  Avoid exposure to all substances that irritate the airway, especially to tobacco smoke.  Wash your hands often with soap and water to reduce the risk of infection. If soap and water are not available, use hand sanitizer.  During flu season, avoid enclosed spaces that are crowded with people. General instructions  Drink enough fluid to keep your urine clear or pale yellow (unless you have a medical  condition that requires fluid restriction).  Use a cool mist vaporizer. This  humidifies the air and makes it easier for you to clear your chest when you cough.  If you have a home nebulizer and oxygen, continue to use them as told by your health care provider.  Keep all follow-up visits as told by your health care provider. This is important. How is this prevented?  Stay up-to-date on pneumococcal and influenza (flu) vaccines. A flu shot is recommended every year to help prevent exacerbations.  Do not use any products that contain nicotine or tobacco, such as cigarettes and e-cigarettes. Quitting smoking is very important in preventing COPD from getting worse and in preventing exacerbations from happening as often. If you need help quitting, ask your health care provider.  Follow all instructions for pulmonary rehabilitation after a recent exacerbation. This can help prevent future exacerbations.  Work with your health care provider to develop and follow an action plan. This tells you what steps to take when you experience certain symptoms. Contact a health care provider if:  You have a worsening of your regular COPD symptoms. Get help right away if:  You have worsening shortness of breath, even when resting.  You have trouble talking.  You have severe chest pain.  You cough up blood.  You have a fever.  You have weakness, vomit repeatedly, or faint.  You feel confused.  You are not able to sleep because of your symptoms.  You have trouble doing daily activities. Summary  COPD exacerbations are episodes when breathing symptoms become much worse and require extra treatment above your normal treatment.  Exacerbations can be severe and even life threatening. Frequent COPD exacerbations can cause further damage to your lungs.  COPD exacerbations are usually triggered by infections such as the flu, colds, and even pneumonia.  Treatment for this condition depends on the severity and cause of the symptoms. You may need to be admitted to a hospital for  treatment.  Quitting smoking is very important to prevent COPD from getting worse and to prevent exacerbations from happening as often. This information is not intended to replace advice given to you by your health care provider. Make sure you discuss any questions you have with your health care provider. Document Revised: 12/21/2016 Document Reviewed: 02/13/2016 [employer] Patient Education  2020 [employer].   IMPORTANT INFORMATION: PAY CLOSE ATTENTION   PHYSICIAN DISCHARGE INSTRUCTIONS  Follow with Primary care provider  Sharilyn Sites, MD  and other consultants as instructed by your Hospitalist Physician  [place] IF SYMPTOMS COME BACK, WORSEN OR NEW PROBLEM DEVELOPS   Please note: You were cared for by a hospitalist during your hospital stay. Every effort will be made to forward records to your primary care provider.  You can request that your primary care provider send for your hospital records if they have not received them.  Once you are discharged, your primary care physician will handle any further medical issues. Please note that NO REFILLS for any discharge medications will be authorized once you are discharged, as it is imperative that you return to your primary care physician (or establish a relationship with a primary care physician if you do not have one) for your post hospital discharge needs so that they can reassess your need for medications and monitor your lab values.  Please get a complete blood count and chemistry panel checked by your Primary MD at your next visit, and again as instructed  by your Primary MD.  Get Medicines reviewed and adjusted: Please take all your medications with you for your next visit with your Primary MD  Laboratory/radiological data: Please request your Primary MD to go over all hospital tests and procedure/radiological results at the follow up, please ask your primary care provider to get all Hospital  records sent to his/her office.  In some cases, they will be blood work, cultures and biopsy results pending at the time of your discharge. Please request that your primary care provider follow up on these results.  If you are diabetic, please bring your blood sugar readings with you to your follow up appointment with primary care.    Please call and make your follow up appointments as soon as possible.    Also Note the following: If you experience worsening of your admission symptoms, develop shortness of breath, life threatening emergency, suicidal or homicidal thoughts you must seek medical attention immediately by calling 911 or calling your MD immediately  if symptoms less severe.  You must read complete instructions/literature along with all the possible adverse reactions/side effects for all the Medicines you take and that have been prescribed to you. Take any new Medicines after you have completely understood and accpet all the possible adverse reactions/side effects.   Do not drive when taking Pain medications or sleeping medications (Benzodiazepines)  Do not take more than prescribed Pain, Sleep and Anxiety Medications. It is not advisable to combine anxiety,sleep and pain medications without talking with your primary care practitioner  Special Instructions: If you have smoked or chewed Tobacco  in the last 2 yrs please stop smoking, stop any regular Alcohol  and or any Recreational drug use.  Wear Seat belts while driving.  Do not drive if taking any narcotic, mind altering or controlled substances or recreational drugs or alcohol.

## 2020-01-29 NOTE — Discharge Summary (Signed)
Physician Discharge Summary  Jared Guerrero [id number] DOB: April 07, 1945 DOA: 01/27/2020  PCP: Sharilyn Sites, MD  Admit date: 01/27/2020 Discharge date: 01/29/2020  Admitted From:  Home  Disposition:  Home   Recommendations for Outpatient Follow-up:  1. Follow up with PCP in 1 weeks  Discharge Condition: STABLE   CODE STATUS: FULL    Brief Hospitalization Summary: Please see all hospital notes, images, labs for full details of the hospitalization. ADMISSION HPI: Jared Guerrero is a 75 y.o. male with medical history significant of AAA, anxiety, COPD, ongoing tobacco use, GERD, hyperlipidemia, hypertension presenting to the ED with complaints of shortness of breath. Patient reports 3 to 4-day history of dyspnea, wheezing, and productive cough.  Patient does not have power at his house so he has been living with his son who has noticed that his breathing is much worse from his baseline.  Patient normally wears 2 L home oxygen all the time.  He has been fully vaccinated against Covid, including booster shot which she received yesterday.  Reports compliance with his home COPD inhalers and son at bedside confirms.  Denies fevers, chills, chest pain, nausea, vomiting, abdominal pain, diarrhea, or dysuria.Denies history of blood clots.  ED Course: Vital signs stable.  Satting well on 2 L home oxygen.  WBC 12.2, hemoglobin 11.2, hematocrit 37.5, platelet count 382K.  Sodium 139, potassium 4.1, chloride 96, bicarb 34, BUN 24, creatinine 0.7, glucose 99.  SARS-CoV-2 PCR test and influenza panel both negative.  Chest x-ray showing COPD with mild bilateral atelectasis and/or infiltrate.  Small bilateral pleural effusions.  Patient was given albuterol inhaler treatment, doxycycline, IV Solu-Medrol 125 mg, and IV magnesium 2 g.  Also required an hour-long albuterol continuous neb treatment.  Admission requested for COPD exacerbation and possible pneumonia.  Hospital Course  1. COPD exacerbation  - Pt reports feeling better, breathing better, he responded well to IV steroids, scheduled bronchodilators and doxycycline.  DC home today with prednisone taper, oral doxycycline, resume home bronchodilators and follow up with PCP.   2. Essential hypertension - BPs stable.  resume home medication.   3. CAP - ruled out with negative procalcitonin and negative viral testing.  4. Tobacco - counseled on cessation, nicotine patch ordered.  5. Physical deconditioning -PT eval appreciated. No PT follow up recommended.  6. GAD - lorazepam PRN.  7. Chronic hypoxemic respiratory failure - he is home oxygen dependent, will resume home oxygen at discharge.   DVT prophylaxis: enoxaparin  Code Status: full   Discharge Diagnoses:  Principal Problem:   COPD exacerbation ([place]) Active Problems:   Essential hypertension   Cigarette smoker   Chronic hypoxemic respiratory failure (H[facility])   SOB (shortness of breath)   Protein-calorie malnutrition, severe   Discharge Instructions:  Allergies as of 01/29/2020      Reactions   No Known Allergies       Medication List    STOP taking these medications   levofloxacin 500 MG tablet Commonly known as: LEVAQUIN     TAKE these medications   albuterol 108 (90 Base) MCG/ACT inhaler Commonly known as: VENTOLIN HFA Inhale 2 puffs into the lungs every 6 (six) hours as needed for wheezing or shortness of breath.   albuterol (2.5 MG/3ML) 0.083% nebulizer solution Commonly known as: PROVENTIL Take 2.5 mg by nebulization every 6 (six) hours as needed for wheezing or shortness of breath.   amLODipine 10 MG tablet Commonly known as: NORVASC Take 10 mg by mouth.   aspirin EC  81 MG tablet Take 162 mg by mouth daily.   budesonide-formoterol 160-4.5 MCG/ACT inhaler Commonly known as: SYMBICORT Inhale 2 puffs into the lungs 2 (two) times daily.   colesevelam 625 MG tablet Commonly known as: WELCHOL Take 625 mg by mouth 2 (two) times daily.    dextromethorphan-guaiFENesin 30-600 MG 12hr tablet Commonly known as: MUCINEX DM Take 1 tablet by mouth 2 (two) times daily.   doxycycline 100 MG capsule Commonly known as: VIBRAMYCIN Take 1 capsule (100 mg total) by mouth 2 (two) times daily for 4 days.   gabapentin 300 MG capsule Commonly known as: NEURONTIN Take 300 mg by mouth at bedtime.   ipratropium 0.02 % nebulizer solution Commonly known as: ATROVENT Take 0.5 mg by nebulization every 6 (six) hours as needed for wheezing or shortness of breath.   LORazepam 1 MG tablet Commonly known as: ATIVAN Take 0.5 mg by mouth 3 (three) times daily as needed for anxiety.   losartan 50 MG tablet Commonly known as: COZAAR Take 50 mg by mouth daily.   multivitamin with minerals Tabs tablet Take 1 tablet by mouth daily. Start taking on: January 30, 2020   predniSONE 20 MG tablet Commonly known as: DELTASONE Take 3 PO QAM x3days, 2 PO QAM x3days, 1 PO QAM x3days Start taking on: January 30, 2020 What changed:   medication strength  how much to take  how to take this  when to take this  additional instructions   rosuvastatin 5 MG tablet Commonly known as: CRESTOR Take 5 mg by mouth daily with supper.   tiotropium 18 MCG inhalation capsule Commonly known as: SPIRIVA Place 18 mcg into inhaler and inhale daily.       Follow-up Information    Assunta FoundGolding, John, MD. Schedule an appointment as soon as possible for a visit in 1 week(s).   Specialty: Family Medicine Contact information: [address] [place] [id number] [phone]              Allergies  Allergen Reactions  . No Known Allergies    Allergies as of 01/29/2020      Reactions   No Known Allergies       Medication List    STOP taking these medications   levofloxacin 500 MG tablet Commonly known as: LEVAQUIN     TAKE these medications   albuterol 108 (90 Base) MCG/ACT inhaler Commonly known as: VENTOLIN HFA Inhale 2 puffs into the  lungs every 6 (six) hours as needed for wheezing or shortness of breath.   albuterol (2.5 MG/3ML) 0.083% nebulizer solution Commonly known as: PROVENTIL Take 2.5 mg by nebulization every 6 (six) hours as needed for wheezing or shortness of breath.   amLODipine 10 MG tablet Commonly known as: NORVASC Take 10 mg by mouth.   aspirin EC 81 MG tablet Take 162 mg by mouth daily.   budesonide-formoterol 160-4.5 MCG/ACT inhaler Commonly known as: SYMBICORT Inhale 2 puffs into the lungs 2 (two) times daily.   colesevelam 625 MG tablet Commonly known as: WELCHOL Take 625 mg by mouth 2 (two) times daily.   dextromethorphan-guaiFENesin 30-600 MG 12hr tablet Commonly known as: MUCINEX DM Take 1 tablet by mouth 2 (two) times daily.   doxycycline 100 MG capsule Commonly known as: VIBRAMYCIN Take 1 capsule (100 mg total) by mouth 2 (two) times daily for 4 days.   gabapentin 300 MG capsule Commonly known as: NEURONTIN Take 300 mg by mouth at bedtime.   ipratropium 0.02 % nebulizer solution Commonly  known as: ATROVENT Take 0.5 mg by nebulization every 6 (six) hours as needed for wheezing or shortness of breath.   LORazepam 1 MG tablet Commonly known as: ATIVAN Take 0.5 mg by mouth 3 (three) times daily as needed for anxiety.   losartan 50 MG tablet Commonly known as: COZAAR Take 50 mg by mouth daily.   multivitamin with minerals Tabs tablet Take 1 tablet by mouth daily. Start taking on: January 30, 2020   predniSONE 20 MG tablet Commonly known as: DELTASONE Take 3 PO QAM x3days, 2 PO QAM x3days, 1 PO QAM x3days Start taking on: January 30, 2020 What changed:   medication strength  how much to take  how to take this  when to take this  additional instructions   rosuvastatin 5 MG tablet Commonly known as: CRESTOR Take 5 mg by mouth daily with supper.   tiotropium 18 MCG inhalation capsule Commonly known as: SPIRIVA Place 18 mcg into inhaler and inhale daily.        Procedures/Studies: DG Ankle 2 Views Left  Result Date: 01/29/2020 CLINICAL DATA:  Bilateral ankle swelling EXAM: LEFT ANKLE - 2 VIEW COMPARISON:  None. FINDINGS: No fracture or dislocation is seen. Mild tibiotalar degenerative changes with chondrocalcinosis. Mild lateral soft tissue swelling. IMPRESSION: Mild degenerative changes with lateral soft tissue swelling. Electronically Signed   By: Julian Hy M.D.   On: 01/29/2020 10:42   DG Ankle 2 Views Right  Result Date: 01/29/2020 CLINICAL DATA:  Bilateral ankle swelling EXAM: RIGHT ANKLE - 2 VIEW COMPARISON:  None. FINDINGS: No fracture or dislocation is seen. The ankle mortise is intact. The base of the fifth metatarsal is unremarkable. Visualized soft tissues are within normal limits. IMPRESSION: Negative. Electronically Signed   By: Julian Hy M.D.   On: 01/29/2020 10:42   DG Chest Port 1 View  Result Date: 01/27/2020 CLINICAL DATA:  Shortness of breath x2 days. EXAM: PORTABLE CHEST 1 VIEW COMPARISON:  November 12, 2019 FINDINGS: Emphysematous lung disease is noted with diffuse chronic appearing increased interstitial lung markings. Mild areas of atelectasis and/or infiltrate are seen within the bilateral lung bases and lateral aspect of the mid to upper right lung. Small bilateral pleural effusions are noted. No pneumothorax is seen. The heart size and mediastinal contours are within normal limits. There is moderate severity calcification of the aortic arch. Stenting of the visualized portion of the abdominal aorta is noted. The visualized skeletal structures are unremarkable. IMPRESSION: 1. COPD with mild bilateral atelectasis and/or infiltrate. 2. Small bilateral pleural effusions. Electronically Signed   By: Virgina Norfolk M.D.   On: 01/27/2020 16:53      Subjective: Pt reports that he feels better, he is breathing better, his cough is much better. He is agreeable to going home and having outpatient follow up with his PCP.    Discharge Exam: Vitals:   01/29/20 0639 01/29/20 0920  BP: 128/60   Pulse: 78   Resp: 16   Temp: 98 F (36.7 C)   SpO2: 97% 97%   Vitals:   01/28/20 2119 01/28/20 2257 01/29/20 0639 01/29/20 0920  BP:  (!) 116/57 128/60   Pulse:  86 78   Resp:   16   Temp:  98.3 F (36.8 C) 98 F (36.7 C)   TempSrc:  Oral Oral   SpO2: 98% 97% 97% 97%  Weight:      Height:       General: Pt is alert, awake, not in acute distress Cardiovascular:  normal S1/S2 +, no rubs, no gallops Respiratory: CTA bilaterally, no wheezing, no rhonchi Abdominal: Soft, NT, ND, bowel sounds + Extremities: no edema, no cyanosis   The results of significant diagnostics from this hospitalization (including imaging, microbiology, ancillary and laboratory) are listed below for reference.    Microbiology: Recent Results (from the past 240 hour(s))  Resp Panel by RT-PCR (Flu A&B, Covid) Nasopharyngeal Swab     Status: None   Collection Time: 01/27/20  4:28 PM   Specimen: Nasopharyngeal Swab; Nasopharyngeal(NP) swabs in vial transport medium  Result Value Ref Range Status   SARS Coronavirus 2 by RT PCR NEGATIVE NEGATIVE Final    Comment: (NOTE) SARS-CoV-2 target nucleic acids are NOT DETECTED.  The SARS-CoV-2 RNA is generally detectable in upper respiratory specimens during the acute phase of infection. The lowest concentration of SARS-CoV-2 viral copies this assay can detect is 138 copies/mL. A negative result does not preclude SARS-Cov-2 infection and should not be used as the sole basis for treatment or other patient management decisions. A negative result may occur with  improper specimen collection/handling, submission of specimen other than nasopharyngeal swab, presence of viral mutation(s) within the areas targeted by this assay, and inadequate number of viral copies(<138 copies/mL). A negative result must be combined with clinical observations, patient history, and epidemiological information. The  expected result is Negative.  Fact Sheet for Patients:  EntrepreneurPulse.com.au  Fact Sheet for Healthcare Providers:  IncredibleEmployment.be  This test is no t yet approved or cleared by the [place] FDA and  has been authorized for detection and/or diagnosis of SARS-CoV-2 by FDA under an Emergency Use Authorization (EUA). This EUA will remain  in effect (meaning this test can be used) for the duration of the COVID-19 declaration under Section 564(b)(1) of the Act, 21 U.S.C.section 360bbb-3(b)(1), unless the authorization is terminated  or revoked sooner.       Influenza A by PCR NEGATIVE NEGATIVE Final   Influenza B by PCR NEGATIVE NEGATIVE Final    Comment: (NOTE) The Xpert Xpress SARS-CoV-2/FLU/RSV plus assay is intended as an aid in the diagnosis of influenza from Nasopharyngeal swab specimens and should not be used as a sole basis for treatment. Nasal washings and aspirates are unacceptable for Xpert Xpress SARS-CoV-2/FLU/RSV testing.  Fact Sheet for Patients: EntrepreneurPulse.com.au  Fact Sheet for Healthcare Providers: IncredibleEmployment.be  This test is not yet approved or cleared by the [place] FDA and has been authorized for detection and/or diagnosis of SARS-CoV-2 by FDA under an Emergency Use Authorization (EUA). This EUA will remain in effect (meaning this test can be used) for the duration of the COVID-19 declaration under Section 564(b)(1) of the Act, 21 U.S.C. section 360bbb-3(b)(1), unless the authorization is terminated or revoked.  Performed at [facility], [address], [place], [place] [address]      Labs: BNP (last 3 results) No results for input(s): BNP in the last 8760 hours. Basic Metabolic Panel: Recent Labs  Lab 01/27/20 1541  NA 139  K 4.1  CL 96*  CO2 34*  GLUCOSE 99  BUN 24*  CREATININE 0.78  CALCIUM 9.1   Liver Function Tests: No results  for input(s): AST, ALT, ALKPHOS, BILITOT, PROT, ALBUMIN in the last 168 hours. No results for input(s): LIPASE, AMYLASE in the last 168 hours. No results for input(s): AMMONIA in the last 168 hours. CBC: Recent Labs  Lab 01/27/20 1541 01/28/20 0829  WBC 12.2* 8.0  HGB 11.2* 10.3*  HCT 37.5* 33.2*  MCV 93.1  90.7  PLT 382 343   Cardiac Enzymes: No results for input(s): CKTOTAL, CKMB, CKMBINDEX, TROPONINI in the last 168 hours. BNP: Invalid input(s): POCBNP CBG: No results for input(s): GLUCAP in the last 168 hours. D-Dimer No results for input(s): DDIMER in the last 72 hours. Hgb A1c No results for input(s): HGBA1C in the last 72 hours. Lipid Profile No results for input(s): CHOL, HDL, LDLCALC, TRIG, CHOLHDL, LDLDIRECT in the last 72 hours. Thyroid function studies No results for input(s): TSH, T4TOTAL, T3FREE, THYROIDAB in the last 72 hours.  Invalid input(s): FREET3 Anemia work up No results for input(s): VITAMINB12, FOLATE, FERRITIN, TIBC, IRON, RETICCTPCT in the last 72 hours. Urinalysis    Component Value Date/Time   COLORURINE STRAW (A) 06/06/2016 1006   APPEARANCEUR CLEAR 06/06/2016 1006   LABSPEC 1.003 (L) 06/06/2016 1006   PHURINE 6.0 06/06/2016 1006   GLUCOSEU NEGATIVE 06/06/2016 1006   HGBUR SMALL (A) 06/06/2016 1006   BILIRUBINUR NEGATIVE 06/06/2016 1006   KETONESUR NEGATIVE 06/06/2016 1006   PROTEINUR NEGATIVE 06/06/2016 1006   NITRITE NEGATIVE 06/06/2016 1006   LEUKOCYTESUR NEGATIVE 06/06/2016 1006   Sepsis Labs Invalid input(s): PROCALCITONIN,  WBC,  LACTICIDVEN Microbiology Recent Results (from the past 240 hour(s))  Resp Panel by RT-PCR (Flu A&B, Covid) Nasopharyngeal Swab     Status: None   Collection Time: 01/27/20  4:28 PM   Specimen: Nasopharyngeal Swab; Nasopharyngeal(NP) swabs in vial transport medium  Result Value Ref Range Status   SARS Coronavirus 2 by RT PCR NEGATIVE NEGATIVE Final    Comment: (NOTE) SARS-CoV-2 target nucleic acids are  NOT DETECTED.  The SARS-CoV-2 RNA is generally detectable in upper respiratory specimens during the acute phase of infection. The lowest concentration of SARS-CoV-2 viral copies this assay can detect is 138 copies/mL. A negative result does not preclude SARS-Cov-2 infection and should not be used as the sole basis for treatment or other patient management decisions. A negative result may occur with  improper specimen collection/handling, submission of specimen other than nasopharyngeal swab, presence of viral mutation(s) within the areas targeted by this assay, and inadequate number of viral copies(<138 copies/mL). A negative result must be combined with clinical observations, patient history, and epidemiological information. The expected result is Negative.  Fact Sheet for Patients:  EntrepreneurPulse.com.au  Fact Sheet for Healthcare Providers:  IncredibleEmployment.be  This test is no t yet approved or cleared by the [place] FDA and  has been authorized for detection and/or diagnosis of SARS-CoV-2 by FDA under an Emergency Use Authorization (EUA). This EUA will remain  in effect (meaning this test can be used) for the duration of the COVID-19 declaration under Section 564(b)(1) of the Act, 21 U.S.C.section 360bbb-3(b)(1), unless the authorization is terminated  or revoked sooner.       Influenza A by PCR NEGATIVE NEGATIVE Final   Influenza B by PCR NEGATIVE NEGATIVE Final    Comment: (NOTE) The Xpert Xpress SARS-CoV-2/FLU/RSV plus assay is intended as an aid in the diagnosis of influenza from Nasopharyngeal swab specimens and should not be used as a sole basis for treatment. Nasal washings and aspirates are unacceptable for Xpert Xpress SARS-CoV-2/FLU/RSV testing.  Fact Sheet for Patients: EntrepreneurPulse.com.au  Fact Sheet for Healthcare Providers: IncredibleEmployment.be  This test is not  yet approved or cleared by the [place] FDA and has been authorized for detection and/or diagnosis of SARS-CoV-2 by FDA under an Emergency Use Authorization (EUA). This EUA will remain in effect (meaning this test can be used) for the  duration of the COVID-19 declaration under Section 564(b)(1) of the Act, 21 U.S.C. section 360bbb-3(b)(1), unless the authorization is terminated or revoked.  Performed at [facility], [address], [place], [place] [address]    Time coordinating discharge:  35 minutes   SIGNED:  Irwin Brakeman, MD  [facility] 01/29/2020, 11:25 AM How to contact the [facility] Attending or Consulting provider [place] or covering provider during after hours [place], for this patient?  1. Check the care team in [facility] and look for a) attending/consulting [facility] provider listed and b) the [facility] team listed 2. Log into [email or web address] and use [facility]'s universal password to access. If you do not have the password, please contact the hospital operator. 3. Locate the [facility] provider you are looking for under [facility] and page to a number that you can be directly reached. 4. If you still have difficulty reaching the provider, please page the [facility] (Director on Call) for the Hospitalists listed on amion for assistance.

## 2020-01-29 NOTE — Care Management Important Message (Signed)
Important Message  Patient Details  Name: Jared Guerrero MRN: [id number] Date of Birth: 14-Jan-1946   Medicare Important Message Given:  Yes     Tommy Medal 01/29/2020, 12:54 PM

## 2020-02-03 DIAGNOSIS — J441 Chronic obstructive pulmonary disease with (acute) exacerbation: Secondary | ICD-10-CM | POA: Diagnosis not present

## 2020-02-03 DIAGNOSIS — Z681 Body mass index (BMI) 19 or less, adult: Secondary | ICD-10-CM | POA: Diagnosis not present

## 2020-02-07 DIAGNOSIS — J449 Chronic obstructive pulmonary disease, unspecified: Secondary | ICD-10-CM | POA: Diagnosis not present

## 2020-02-14 ENCOUNTER — Other Ambulatory Visit: Payer: Self-pay

## 2020-02-14 ENCOUNTER — Emergency Department (HOSPITAL_COMMUNITY)
Admission: EM | Admit: 2020-02-14 | Discharge: 2020-02-14 | Disposition: A | Discharge status: Home / Self Care | Payer: Medicare Other | Attending: Emergency Medicine | Admitting: Emergency Medicine

## 2020-02-14 ENCOUNTER — Encounter (HOSPITAL_COMMUNITY): Payer: Self-pay

## 2020-02-14 ENCOUNTER — Emergency Department (HOSPITAL_COMMUNITY): Payer: Medicare Other

## 2020-02-14 DIAGNOSIS — R410 Disorientation, unspecified: Secondary | ICD-10-CM | POA: Diagnosis not present

## 2020-02-14 DIAGNOSIS — I10 Essential (primary) hypertension: Secondary | ICD-10-CM | POA: Diagnosis not present

## 2020-02-14 DIAGNOSIS — R0602 Shortness of breath: Secondary | ICD-10-CM | POA: Diagnosis not present

## 2020-02-14 DIAGNOSIS — Z79899 Other long term (current) drug therapy: Secondary | ICD-10-CM | POA: Insufficient documentation

## 2020-02-14 DIAGNOSIS — R069 Unspecified abnormalities of breathing: Secondary | ICD-10-CM | POA: Diagnosis not present

## 2020-02-14 DIAGNOSIS — R062 Wheezing: Secondary | ICD-10-CM | POA: Diagnosis present

## 2020-02-14 DIAGNOSIS — J439 Emphysema, unspecified: Secondary | ICD-10-CM | POA: Diagnosis not present

## 2020-02-14 DIAGNOSIS — J449 Chronic obstructive pulmonary disease, unspecified: Secondary | ICD-10-CM | POA: Diagnosis not present

## 2020-02-14 DIAGNOSIS — Z20822 Contact with and (suspected) exposure to covid-19: Secondary | ICD-10-CM | POA: Insufficient documentation

## 2020-02-14 DIAGNOSIS — F17210 Nicotine dependence, cigarettes, uncomplicated: Secondary | ICD-10-CM | POA: Insufficient documentation

## 2020-02-14 DIAGNOSIS — Z7982 Long term (current) use of aspirin: Secondary | ICD-10-CM | POA: Diagnosis not present

## 2020-02-14 DIAGNOSIS — J441 Chronic obstructive pulmonary disease with (acute) exacerbation: Secondary | ICD-10-CM | POA: Diagnosis not present

## 2020-02-14 DIAGNOSIS — Z7951 Long term (current) use of inhaled steroids: Secondary | ICD-10-CM | POA: Diagnosis not present

## 2020-02-14 DIAGNOSIS — R6889 Other general symptoms and signs: Secondary | ICD-10-CM | POA: Diagnosis not present

## 2020-02-14 DIAGNOSIS — Z743 Need for continuous supervision: Secondary | ICD-10-CM | POA: Diagnosis not present

## 2020-02-14 DIAGNOSIS — J45909 Unspecified asthma, uncomplicated: Secondary | ICD-10-CM | POA: Diagnosis not present

## 2020-02-14 LAB — CBC WITH DIFFERENTIAL/PLATELET
Abs Immature Granulocytes: 0.31 10*3/uL — ABNORMAL HIGH (ref 0.00–0.07)
Basophils Absolute: 0 10*3/uL (ref 0.0–0.1)
Basophils Relative: 0 %
Eosinophils Absolute: 0 10*3/uL (ref 0.0–0.5)
Eosinophils Relative: 0 %
HCT: 38.5 % — ABNORMAL LOW (ref 39.0–52.0)
Hemoglobin: 11.5 g/dL — ABNORMAL LOW (ref 13.0–17.0)
Immature Granulocytes: 1 %
Lymphocytes Relative: 2 %
Lymphs Abs: 0.5 10*3/uL — ABNORMAL LOW (ref 0.7–4.0)
MCH: 28.6 pg (ref 26.0–34.0)
MCHC: 29.9 g/dL — ABNORMAL LOW (ref 30.0–36.0)
MCV: 95.8 fL (ref 80.0–100.0)
Monocytes Absolute: 0.4 10*3/uL (ref 0.1–1.0)
Monocytes Relative: 2 %
Neutro Abs: 22.1 10*3/uL — ABNORMAL HIGH (ref 1.7–7.7)
Neutrophils Relative %: 95 %
Platelets: 208 10*3/uL (ref 150–400)
RBC: 4.02 MIL/uL — ABNORMAL LOW (ref 4.22–5.81)
RDW: 18.7 % — ABNORMAL HIGH (ref 11.5–15.5)
WBC: 23.4 10*3/uL — ABNORMAL HIGH (ref 4.0–10.5)
nRBC: 0 % (ref 0.0–0.2)

## 2020-02-14 LAB — BASIC METABOLIC PANEL
Anion gap: 8 (ref 5–15)
BUN: 23 mg/dL (ref 8–23)
CO2: 34 mmol/L — ABNORMAL HIGH (ref 22–32)
Calcium: 9.4 mg/dL (ref 8.9–10.3)
Chloride: 95 mmol/L — ABNORMAL LOW (ref 98–111)
Creatinine, Ser: 0.8 mg/dL (ref 0.61–1.24)
GFR, Estimated: >60 mL/min (ref >60)
Glucose, Bld: 183 mg/dL — ABNORMAL HIGH (ref 70–99)
Potassium: 5.4 mmol/L — ABNORMAL HIGH (ref 3.5–5.1)
Sodium: 137 mmol/L (ref 135–145)

## 2020-02-14 LAB — SARS CORONAVIRUS 2 BY RT PCR (HOSPITAL ORDER, PERFORMED IN ~~LOC~~ HOSPITAL LAB): SARS Coronavirus 2: NEGATIVE

## 2020-02-14 MED ORDER — LEVOFLOXACIN 750 MG PO TABS: 750.0000 mg | ORAL_TABLET | Freq: Every day | ORAL | 0 refills | Status: AC

## 2020-02-14 MED ORDER — IPRATROPIUM-ALBUTEROL 0.5-2.5 (3) MG/3ML IN SOLN
9.0000 mL | Freq: Once | RESPIRATORY_TRACT | Status: AC
  Administered 2020-02-14: 3 mL via RESPIRATORY_TRACT
  Filled 2020-02-14: qty 3

## 2020-02-14 MED ORDER — METHYLPREDNISOLONE SODIUM SUCC 125 MG IJ SOLR
125.0000 mg | Freq: Once | INTRAMUSCULAR | Status: AC
  Administered 2020-02-14: 125 mg via INTRAVENOUS
  Filled 2020-02-14: qty 2

## 2020-02-14 MED ORDER — LEVOFLOXACIN 750 MG PO TABS
750.0000 mg | ORAL_TABLET | Freq: Once | ORAL | Status: AC
  Administered 2020-02-14: 750 mg via ORAL
  Filled 2020-02-14: qty 1

## 2020-02-14 MED ORDER — PREDNISONE 10 MG PO TABS: ORAL_TABLET | ORAL | 0 refills | Status: AC

## 2020-02-14 MED ORDER — MAGNESIUM SULFATE 2 GM/50ML IV SOLN
2.0000 g | Freq: Once | INTRAVENOUS | Status: AC
  Administered 2020-02-14: 2 g via INTRAVENOUS
  Filled 2020-02-14: qty 50

## 2020-02-14 NOTE — ED Triage Notes (Signed)
Pt brought in by EMS. Reported that pt woke up confused, weakness, and cough. Pt went back to bed and then woke up about 3pm  Wears chronic O2 at 2 L  . Sats 98%. Pt has been on prednisone for one week. Conts to smoke. Pt given 2 duonebs prior to arrival

## 2020-02-14 NOTE — ED Provider Notes (Signed)
[facility] EMERGENCY DEPARTMENT Provider Note   CSN: [id number] Arrival date & time: 02/14/20  1805     History Chief Complaint  Patient presents with  . Shortness of Breath    Jared Guerrero is a 75 y.o. male w/ hx of AAA, severe asthma/emphysema on 4L [place] at baseline, HTN, presenting to ED with wheezing, SOB.  He was last discharged from the hospital on 01/29/20 after COPD exacerbation with questionable PNA, treated with course of doxycycline at home.  He reports he had some mild improvement but returned to chronic wheezing.  His son who lives with him tells me that the father is constantly huffing and wheezing, particularly at night, and he wakes up gasping.  There is sometimes confusion in the morning.  The patient does not wear CPAP at night.  His pulmonologist is Dr Melvyn Novas, he has not seen him in "years" but does not have a reason why.  He continues to smoke about half a pack a day, and has a lifelong history of smoking.  He uses albuterol nebulizers approximately twice a day.  He also reports that he is on a current course of steroids, has 1 more day to go.  He has not taken any antibiotics.  He denies fevers or chills.  HPI     Past Medical History:  Diagnosis Date  . AAA (abdominal aortic aneurysm) ([place])   . Adenomatous polyp   . Anxiety    takes Ativan as needed  . Asthma    Symbicort inhaler and Atrovent neb as needed.Spiriva daily  . Bronchitis   . COPD (chronic obstructive pulmonary disease) ([facility])    Albuerol inhaler as needed.Albuterol neb as needed  . GERD (gastroesophageal reflux disease)    takes Protonix daily  . Hyperlipidemia    takes Welchol and Crestor  daily  . Hypertension    takes Losartan and Amlodipine daily  . Productive cough     Patient Active Problem List   Diagnosis Date Noted  . SOB (shortness of breath) 01/28/2020  . Protein-calorie malnutrition, severe 01/28/2020  . COPD exacerbation ([place]) 01/27/2020  . Chronic hypoxemic respiratory  failure ([place]) 01/27/2020  . History of abdominal aortic aneurysm (AAA) 06/14/2016  . Preoperative clearance 05/01/2016  . Abnormal CT of the chest 10/10/2015  . History of colonic polyps 10/10/2015  . Iliac artery aneurysm ([place]) 08/05/2013  . Cigarette smoker 12/26/2011  . Essential hypertension 12/20/2011  . Tremor 12/19/2011  . COPD GOLD III still smoking 12/19/2011    Past Surgical History:  Procedure Laterality Date  . ABDOMINAL AORTIC ENDOVASCULAR STENT GRAFT N/A 06/14/2016   Procedure: ABDOMINAL AORTIC ENDOVASCULAR STENT GRAFT;  Surgeon: Angelia Mould, MD;  Location: [place];  Service: Vascular;  Laterality: N/A;  . ABDOMINAL AORTOGRAM N/A 04/16/2016   Procedure: Abdominal Aortogram;  Surgeon: Angelia Mould, MD;  Location: [place] [facility];  Service: Cardiovascular;  Laterality: N/A;  . BIOPSY  10/20/2015   Procedure: BIOPSY;  Surgeon: Daneil Dolin, MD;  [facility];  Service: Endoscopy;;  gastric  . COLONOSCOPY  06/17/00   Dr. Vivi Ferns rectum, diminutive polyp in the sigmoid, flat polyp in the cecum, partially removed- adenomatous polyp  . COLONOSCOPY  09/16/00   Dr. Gala Romney- normal appearing rectum, clot overlying a polypectomy site with oozing in the cecum , this lesion was treated, small cecal polyps= tubular adenoma  . COLONOSCOPY N/A 10/20/2015   Procedure: COLONOSCOPY;  Surgeon: Daneil Dolin, MD;  [facility];  Service: Endoscopy;  Laterality: N/A;  12:00 PM  . EMBOLIZATION Right 04/16/2016   Procedure: Embolization;  Surgeon: Angelia Mould, MD;  Location: [place] [facility];  Service: Cardiovascular;  Laterality: Right;  internal iliac  . ESOPHAGOGASTRODUODENOSCOPY N/A 10/20/2015   Procedure: ESOPHAGOGASTRODUODENOSCOPY (EGD);  Surgeon: Daneil Dolin, MD;  [facility];  Service: Endoscopy;  Laterality: N/A;  . FOOT SURGERY  2005   right foot   . IR ANGIOGRAM FOLLOW UP STUDY  04/16/2016  . LOWER EXTREMITY  ANGIOGRAPHY Bilateral 04/16/2016   Procedure: Lower Extremity Angiography;  Surgeon: Angelia Mould, MD;  Location: [place] [facility];  Service: Cardiovascular;  Laterality: Bilateral;  . POLYPECTOMY  10/20/2015   Procedure: POLYPECTOMY;  Surgeon: Daneil Dolin, MD;  [facility];  Service: Endoscopy;;  colon  . SPINE SURGERY  1987   L4-5 diskectomy       Family History  Problem Relation Age of Onset  . Heart disease Mother 62  . Hyperlipidemia Mother   . Heart attack Mother   . Hypertension Mother   . Heart attack Father 66  . Heart disease Father        before age 42  . Hyperlipidemia Father   . Emphysema Father        was a smoker  . Hypertension Father   . Aneurysm Maternal Grandfather   . Thyroid cancer Brother   . Colon cancer Neg Hx   . Esophageal cancer Neg Hx   . Stomach cancer Neg Hx     Social History   Tobacco Use  . Smoking status: Current Every Day Smoker    Packs/day: 0.50    Years: 45.00    Pack years: 22.50    Types: Cigarettes  . Smokeless tobacco: Never Used  Vaping Use  . Vaping Use: Never used  Substance Use Topics  . Alcohol use: No    Alcohol/week: 0.0 standard drinks  . Drug use: No    Home Medications Prior to Admission medications   Medication Sig Start Date End Date Taking? Authorizing Provider  levofloxacin (LEVAQUIN) 750 MG tablet Take 1 tablet (750 mg total) by mouth daily for 4 days. 02/15/20 02/19/20 Yes Tineshia Becraft, Carola Rhine, MD  predniSONE (DELTASONE) 10 MG tablet Take 4 tablets (40 mg total) by mouth daily with breakfast for 5 days, THEN 2 tablets (20 mg total) daily with breakfast for 5 days. 02/15/20 02/25/20 Yes Julina Altmann, Carola Rhine, MD  albuterol (PROVENTIL HFA;VENTOLIN HFA) 108 (90 Base) MCG/ACT inhaler Inhale 2 puffs into the lungs every 6 (six) hours as needed for wheezing or shortness of breath.    [provider]  albuterol (PROVENTIL) (2.5 MG/3ML) 0.083% nebulizer solution Take 2.5 mg by nebulization  every 6 (six) hours as needed for wheezing or shortness of breath.  03/19/16   [provider]  amLODipine (NORVASC) 10 MG tablet Take 10 mg by mouth. 01/18/20   [provider]  aspirin EC 81 MG tablet Take 162 mg by mouth daily.     [provider]  budesonide-formoterol (SYMBICORT) 160-4.5 MCG/ACT inhaler Inhale 2 puffs into the lungs 2 (two) times daily.    [provider]  colesevelam (WELCHOL) 625 MG tablet Take 625 mg by mouth 2 (two) times daily.    [provider]  dextromethorphan-guaiFENesin (MUCINEX DM) 30-600 MG 12hr tablet Take 1 tablet by mouth 2 (two) times daily.    [provider]  gabapentin (NEURONTIN) 300 MG  capsule Take 300 mg by mouth at bedtime.  09/07/15   [provider]  ipratropium (ATROVENT) 0.02 % nebulizer solution Take 0.5 mg by nebulization every 6 (six) hours as needed for wheezing or shortness of breath.  02/14/16   [provider]  LORazepam (ATIVAN) 1 MG tablet Take 0.5 mg by mouth 3 (three) times daily as needed for anxiety.     [provider]  losartan (COZAAR) 50 MG tablet Take 50 mg by mouth daily.    [provider]  Multiple Vitamin (MULTIVITAMIN WITH MINERALS) TABS tablet Take 1 tablet by mouth daily. 01/30/20   Johnson, Clanford L, MD  predniSONE (DELTASONE) 20 MG tablet Take 3 PO QAM x3days, 2 PO QAM x3days, 1 PO QAM x3days 01/30/20   Johnson, Clanford L, MD  rosuvastatin (CRESTOR) 5 MG tablet Take 5 mg by mouth daily with supper.  03/19/16   [provider]  tiotropium (SPIRIVA) 18 MCG inhalation capsule Place 18 mcg into inhaler and inhale daily.    [provider]    Allergies    No known allergies  Review of Systems   Review of Systems  Constitutional: Negative for chills and fever.  HENT: Negative for ear pain and sore throat.   Eyes: Negative for pain and visual disturbance.  Respiratory: Positive for cough and shortness of breath.    Cardiovascular: Negative for chest pain and palpitations.  Gastrointestinal: Negative for abdominal pain and vomiting.  Genitourinary: Negative for dysuria and hematuria.  Musculoskeletal: Positive for arthralgias and myalgias.  Skin: Negative for color change and rash.  Neurological: Negative for syncope and headaches.  All other systems reviewed and are negative.   Physical Exam Updated Vital Signs BP 129/69   Pulse 99   Temp 98 F (36.7 C) (Oral)   Resp 20   Ht 5\' 9"  (1.753 m)   Wt 52 kg   SpO2 92%   BMI 16.93 kg/m   Physical Exam Constitutional:      General: He is not in acute distress.    Comments: Thin, chronically ill appearing  HENT:     Head: Normocephalic and atraumatic.  Eyes:     Conjunctiva/sclera: Conjunctivae normal.     Pupils: Pupils are equal, round, and reactive to light.  Cardiovascular:     Rate and Rhythm: Normal rate and regular rhythm.  Pulmonary:     Effort: Pulmonary effort is normal.     Comments: Pursed lips, wheezing diffusely 95% on baseline O2 requirement Abdominal:     General: There is no distension.     Tenderness: There is no abdominal tenderness.  Skin:    General: Skin is warm and dry.  Neurological:     General: No focal deficit present.     Mental Status: He is alert. Mental status is at baseline.  Psychiatric:        Mood and Affect: Mood normal.        Behavior: Behavior normal.     ED Results / Procedures / Treatments   Labs (all labs ordered are listed, but only abnormal results are displayed) Labs Reviewed  BASIC METABOLIC PANEL - Abnormal; Notable for the following components:      Result Value   Potassium 5.4 (*)    Chloride 95 (*)    CO2 34 (*)    Glucose, Bld 183 (*)    All other components within normal limits  CBC WITH DIFFERENTIAL/PLATELET - Abnormal; Notable for the following components:   WBC  23.4 (*)    RBC 4.02 (*)    Hemoglobin 11.5 (*)    HCT 38.5 (*)    MCHC 29.9 (*)    RDW 18.7 (*)     Neutro Abs 22.1 (*)    Lymphs Abs 0.5 (*)    Abs Immature Granulocytes 0.31 (*)    All other components within normal limits  SARS CORONAVIRUS 2 BY RT PCR (HOSPITAL ORDER, [place] [facility])    EKG None  Radiology DG Chest Port 1 View  Result Date: 02/14/2020 CLINICAL DATA:  COPD exacerbation. Evaluate for pneumonia. Shortness of breath. EXAM: PORTABLE CHEST 1 VIEW COMPARISON:  01/27/2020 FINDINGS: Osteopenia. Midline trachea. Normal heart size. Atherosclerosis in the transverse aorta. Trace bilateral pleural fluid or thickening, blunting the costophrenic angles. No pneumothorax. Hyperinflation with upper lung lucency, consistent with COPD/emphysema. Inferior right upper and bibasilar patchy airspace disease. Felt to be slightly decreased compared to 01/27/2020. IMPRESSION: Right greater than left airspace opacities, felt to be slightly improved compared to 01/27/2020. Residual or recurrent infection versus chronic aspiration aspiration favored. Trace bilateral pleural fluid or thickening. Hyperinflation consistent with COPD.  Emphysema (ICD10-J43.9). Aortic Atherosclerosis (ICD10-I70.0). Electronically Signed   By: Abigail Miyamoto M.D.   On: 02/14/2020 19:02    Procedures .Critical Care Performed by: Wyvonnia Dusky, MD Authorized by: Wyvonnia Dusky, MD   Critical care provider statement:    Critical care time (minutes):  45   Critical care was necessary to treat or prevent imminent or life-threatening deterioration of the following conditions:  Respiratory failure   Critical care was time spent personally by me on the following activities:  Discussions with consultants, evaluation of patient's response to treatment, examination of patient, ordering and performing treatments and interventions, ordering and review of laboratory studies, ordering and review of radiographic studies, pulse oximetry, re-evaluation of patient's condition, obtaining history from patient or  surrogate and review of old charts Comments:     IV magnesium, duonebs, COPD exacerbation   (including critical care time)  Medications Ordered in ED Medications  ipratropium-albuterol (DUONEB) 0.5-2.5 (3) MG/3ML nebulizer solution 9 mL (3 mLs Nebulization Given 02/14/20 1843)  magnesium sulfate IVPB 2 g 50 mL (0 g Intravenous Stopped 02/14/20 1950)  methylPREDNISolone sodium succinate (SOLU-MEDROL) 125 mg/2 mL injection 125 mg (125 mg Intravenous Given 02/14/20 1839)  levofloxacin (LEVAQUIN) tablet 750 mg (750 mg Oral Given 02/14/20 2114)    ED Course  I have reviewed the triage vital signs and the nursing notes.  Pertinent labs & imaging results that were available during my care of the patient were reviewed by me and considered in my medical decision making (see chart for details).  75 year old who present emergency department suspected COPD or emphysema exacerbation.  He is a lifelong smoker, and it appears to be acute on chronic condition.  He is mentating well and is on his home oxygen requirement.  However he does have significant wheezing on exam.  He was treated with IV magnesium, followed by DuoNeb's, another burst of steroids.  Subsequently did have significant improvement on reexamination.  He was to go home.  Please see ED course below.  Labs were reviewed with the BMP showing some mild hyperK, covid neagtive, CBC with WBC 23.4.  This may be related to prolonged course of steroids.  However I am concerned about possibility of an underlying infection.  His x-ray shows chronic scarring and possible residual pneumonia signs.  I felt it was reasonable to treat him  a 5-day course of Levaquin.  Also strongly encouraged him to call his pulmonologist tomorrow and get in for an urgent appointment.   We talked about smoking cessation, he has been trying for many years, been unable to quit, this is unfortunate, as explained that his continue worsening his condition, which is very likely  irreversible at this point.  But cessation may help prevent such frequent exacerbations.  Clinical Course as of 02/14/20 2303  Nancy Fetter Feb 14, 2020  1905 WBC elevated in setting of chronic steroid use [MT]  2021 Significantly improving in WOB and wheezing after duonebs, magnesium.  Pt feeling better. [MT]  2034 I updated the patient's son by phone regarding his father's chronic lung disease.  It is very importantly follow-up with a lung specialist given his advanced disease.  We also talked about a every 4 to 6-hour breathing treatment regimen for several days, as well as continued steroid taper.  Although there is no clear evidence of new pneumonia on the x-ray, he does have chronic scar tissue and residual pneumonia, with a white count of 23,000.  I think is reasonable to treat with a 5-day course of Levaquin.  We will give a dose here and prescribe 4 more days. [MT]    Clinical Course User Index [MT] Bettie Capistran, Carola Rhine, MD    Final Clinical Impression(s) / ED Diagnoses Final diagnoses:  COPD exacerbation [facility])    Rx / DC Orders ED Discharge Orders         Ordered    levofloxacin (LEVAQUIN) 750 MG tablet  Daily        02/14/20 2034    predniSONE (DELTASONE) 10 MG tablet        02/14/20 2034           Wyvonnia Dusky, MD 02/14/20 2303

## 2020-02-14 NOTE — Discharge Instructions (Signed)
For the next several days, you give yourself nebulizer treatments every 4-6 hours.  You should also continue taking steroids tomorrow morning - I've extended your prescription 10 days on low dose prednisone.  I also prescribed you 4 more days of Levaquin, an antibiotic, to take starting tomorrow.  Most importantly, you need to call Dr. Gustavus Bryant office tomorrow to schedule an urgent follow-up appointment.  You have very severe lung disease, and need the attention of a specialist.

## 2020-02-15 DIAGNOSIS — J449 Chronic obstructive pulmonary disease, unspecified: Secondary | ICD-10-CM | POA: Diagnosis not present

## 2020-02-29 ENCOUNTER — Institutional Professional Consult (permissible substitution): Payer: Medicare Other | Admitting: Internal Medicine

## 2020-03-22 DEATH — deceased
